# Patient Record
Sex: Male | Born: 1935
Health system: Southern US, Community
[De-identification: ages and names within clinical notes are randomized; demographics above are authoritative.]

## PROBLEM LIST (undated history)

## (undated) DIAGNOSIS — K259 Gastric ulcer, unspecified as acute or chronic, without hemorrhage or perforation: Secondary | ICD-10-CM

## (undated) DIAGNOSIS — G4730 Sleep apnea, unspecified: Secondary | ICD-10-CM

## (undated) DIAGNOSIS — I714 Abdominal aortic aneurysm, without rupture, unspecified: Secondary | ICD-10-CM

## (undated) DIAGNOSIS — E782 Mixed hyperlipidemia: Secondary | ICD-10-CM

## (undated) DIAGNOSIS — R5383 Other fatigue: Secondary | ICD-10-CM

## (undated) DIAGNOSIS — F329 Major depressive disorder, single episode, unspecified: Secondary | ICD-10-CM

## (undated) DIAGNOSIS — N189 Chronic kidney disease, unspecified: Secondary | ICD-10-CM

## (undated) DIAGNOSIS — M8580 Other specified disorders of bone density and structure, unspecified site: Secondary | ICD-10-CM

## (undated) DIAGNOSIS — I119 Hypertensive heart disease without heart failure: Secondary | ICD-10-CM

## (undated) DIAGNOSIS — J449 Chronic obstructive pulmonary disease, unspecified: Secondary | ICD-10-CM

## (undated) DIAGNOSIS — K219 Gastro-esophageal reflux disease without esophagitis: Secondary | ICD-10-CM

## (undated) DIAGNOSIS — E119 Type 2 diabetes mellitus without complications: Secondary | ICD-10-CM

## (undated) DIAGNOSIS — I2510 Atherosclerotic heart disease of native coronary artery without angina pectoris: Secondary | ICD-10-CM

## (undated) DIAGNOSIS — Z86010 Personal history of colonic polyps: Secondary | ICD-10-CM

## (undated) DIAGNOSIS — A048 Other specified bacterial intestinal infections: Secondary | ICD-10-CM

## (undated) DIAGNOSIS — F32A Depression, unspecified: Secondary | ICD-10-CM

## (undated) DIAGNOSIS — R413 Other amnesia: Secondary | ICD-10-CM

## (undated) DIAGNOSIS — Z8489 Family history of other specified conditions: Secondary | ICD-10-CM

## (undated) DIAGNOSIS — R739 Hyperglycemia, unspecified: Secondary | ICD-10-CM

## (undated) DIAGNOSIS — E291 Testicular hypofunction: Secondary | ICD-10-CM

## (undated) DIAGNOSIS — E7800 Pure hypercholesterolemia, unspecified: Secondary | ICD-10-CM

## (undated) DIAGNOSIS — I10 Essential (primary) hypertension: Secondary | ICD-10-CM

## (undated) DIAGNOSIS — R931 Abnormal findings on diagnostic imaging of heart and coronary circulation: Secondary | ICD-10-CM

## (undated) DIAGNOSIS — I712 Thoracic aortic aneurysm, without rupture, unspecified: Secondary | ICD-10-CM

## (undated) DIAGNOSIS — K2270 Barrett's esophagus without dysplasia: Secondary | ICD-10-CM

## (undated) DIAGNOSIS — E559 Vitamin D deficiency, unspecified: Secondary | ICD-10-CM

## (undated) DIAGNOSIS — R9431 Abnormal electrocardiogram [ECG] [EKG]: Secondary | ICD-10-CM

## (undated) DIAGNOSIS — K635 Polyp of colon: Secondary | ICD-10-CM

## (undated) HISTORY — DX: Testicular hypofunction: E29.1

## (undated) HISTORY — DX: Abdominal aortic aneurysm, without rupture, unspecified: I71.40

## (undated) HISTORY — DX: Abdominal aortic aneurysm, without rupture: I71.4

## (undated) HISTORY — DX: Abnormal electrocardiogram (ECG) (EKG): R94.31

## (undated) HISTORY — PX: HEMICOLECTOMY: SHX854

## (undated) HISTORY — DX: Personal history of colonic polyps: Z86.010

## (undated) HISTORY — DX: Barrett's esophagus without dysplasia: K22.70

## (undated) HISTORY — DX: Chronic obstructive pulmonary disease, unspecified: J44.9

## (undated) HISTORY — DX: Sleep apnea, unspecified: G47.30

## (undated) HISTORY — DX: Gastro-esophageal reflux disease without esophagitis: K21.9

## (undated) HISTORY — DX: Type 2 diabetes mellitus without complications: E11.9

## (undated) HISTORY — DX: Depression, unspecified: F32.A

## (undated) HISTORY — DX: Mixed hyperlipidemia: E78.2

## (undated) HISTORY — PX: OTHER SURGICAL HISTORY: SHX169

## (undated) HISTORY — PX: PROSTATECTOMY: SHX69

## (undated) HISTORY — DX: Other specified disorders of bone density and structure, unspecified site: M85.80

## (undated) HISTORY — DX: Other amnesia: R41.3

## (undated) HISTORY — DX: Other fatigue: R53.83

## (undated) HISTORY — DX: Hypertensive heart disease without heart failure: I11.9

## (undated) HISTORY — DX: Gastric ulcer, unspecified as acute or chronic, without hemorrhage or perforation: K25.9

## (undated) HISTORY — DX: Hyperglycemia, unspecified: R73.9

## (undated) HISTORY — DX: Vitamin D deficiency, unspecified: E55.9

## (undated) HISTORY — DX: Other specified bacterial intestinal infections: A04.8

## (undated) HISTORY — DX: Major depressive disorder, single episode, unspecified: F32.9

## (undated) HISTORY — DX: Essential (primary) hypertension: I10

## (undated) HISTORY — DX: Polyp of colon: K63.5

## (undated) HISTORY — DX: Abnormal findings on diagnostic imaging of heart and coronary circulation: R93.1

## (undated) HISTORY — DX: Chronic kidney disease, unspecified: N18.9

## (undated) HISTORY — PX: CATARACT EXTRACTION: SUR2

## (undated) HISTORY — DX: Pure hypercholesterolemia, unspecified: E78.00

---

## 2015-10-01 HISTORY — PX: BACK SURGERY: SHX140

## 2018-01-25 ENCOUNTER — Encounter: Payer: Self-pay | Admitting: *Deleted

## 2018-01-27 ENCOUNTER — Ambulatory Visit: Payer: Medicare HMO | Admitting: Diagnostic Neuroimaging

## 2018-09-14 ENCOUNTER — Encounter (HOSPITAL_BASED_OUTPATIENT_CLINIC_OR_DEPARTMENT_OTHER): Payer: Self-pay

## 2018-09-14 ENCOUNTER — Other Ambulatory Visit: Payer: Self-pay

## 2018-09-14 ENCOUNTER — Emergency Department (HOSPITAL_BASED_OUTPATIENT_CLINIC_OR_DEPARTMENT_OTHER)
Admission: EM | Admit: 2018-09-14 | Discharge: 2018-09-14 | Disposition: A | Discharge status: Home / Self Care | Payer: Medicare Other | Attending: Emergency Medicine | Admitting: Emergency Medicine

## 2018-09-14 ENCOUNTER — Emergency Department (HOSPITAL_BASED_OUTPATIENT_CLINIC_OR_DEPARTMENT_OTHER): Payer: Medicare Other

## 2018-09-14 DIAGNOSIS — Z7982 Long term (current) use of aspirin: Secondary | ICD-10-CM | POA: Diagnosis not present

## 2018-09-14 DIAGNOSIS — N182 Chronic kidney disease, stage 2 (mild): Secondary | ICD-10-CM | POA: Insufficient documentation

## 2018-09-14 DIAGNOSIS — Y998 Other external cause status: Secondary | ICD-10-CM | POA: Diagnosis not present

## 2018-09-14 DIAGNOSIS — S99921A Unspecified injury of right foot, initial encounter: Secondary | ICD-10-CM | POA: Diagnosis present

## 2018-09-14 DIAGNOSIS — J449 Chronic obstructive pulmonary disease, unspecified: Secondary | ICD-10-CM | POA: Insufficient documentation

## 2018-09-14 DIAGNOSIS — Y92481 Parking lot as the place of occurrence of the external cause: Secondary | ICD-10-CM | POA: Insufficient documentation

## 2018-09-14 DIAGNOSIS — S92901A Unspecified fracture of right foot, initial encounter for closed fracture: Secondary | ICD-10-CM

## 2018-09-14 DIAGNOSIS — E1122 Type 2 diabetes mellitus with diabetic chronic kidney disease: Secondary | ICD-10-CM | POA: Diagnosis not present

## 2018-09-14 DIAGNOSIS — Z79899 Other long term (current) drug therapy: Secondary | ICD-10-CM | POA: Insufficient documentation

## 2018-09-14 DIAGNOSIS — Y9389 Activity, other specified: Secondary | ICD-10-CM | POA: Insufficient documentation

## 2018-09-14 DIAGNOSIS — I129 Hypertensive chronic kidney disease with stage 1 through stage 4 chronic kidney disease, or unspecified chronic kidney disease: Secondary | ICD-10-CM | POA: Diagnosis not present

## 2018-09-14 DIAGNOSIS — Z87891 Personal history of nicotine dependence: Secondary | ICD-10-CM | POA: Diagnosis not present

## 2018-09-14 MED ORDER — TRAMADOL HCL 50 MG PO TABS: 50.0000 mg | ORAL_TABLET | Freq: Four times a day (QID) | ORAL | 0 refills | Status: DC | PRN

## 2018-09-14 MED FILL — traMADol HCL 50 MG TABS: 50 | 3 days supply | Qty: 15 | Fill #0
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
## 2018-09-14 NOTE — Discharge Instructions (Signed)
Call and make an appointment to sports medicine.  X-rays show evidence of a right cuboid fracture.  Use the cam walker this is the recommended treatment for that type of fracture.  Take the tramadol as needed for pain.  Prescription provided.

## 2018-11-02 ENCOUNTER — Other Ambulatory Visit: Payer: Self-pay

## 2018-11-02 ENCOUNTER — Encounter (HOSPITAL_BASED_OUTPATIENT_CLINIC_OR_DEPARTMENT_OTHER): Payer: Self-pay | Admitting: Emergency Medicine

## 2018-11-02 ENCOUNTER — Emergency Department (HOSPITAL_BASED_OUTPATIENT_CLINIC_OR_DEPARTMENT_OTHER): Payer: Medicare Other

## 2018-11-02 ENCOUNTER — Inpatient Hospital Stay (HOSPITAL_BASED_OUTPATIENT_CLINIC_OR_DEPARTMENT_OTHER)
Admission: EM | Admit: 2018-11-02 | Discharge: 2018-11-04 | DRG: 313 | Disposition: A | Discharge status: Home Health | Payer: Medicare Other | Attending: Internal Medicine | Admitting: Internal Medicine

## 2018-11-02 DIAGNOSIS — J438 Other emphysema: Secondary | ICD-10-CM | POA: Diagnosis not present

## 2018-11-02 DIAGNOSIS — Z7982 Long term (current) use of aspirin: Secondary | ICD-10-CM | POA: Diagnosis not present

## 2018-11-02 DIAGNOSIS — Z8719 Personal history of other diseases of the digestive system: Secondary | ICD-10-CM

## 2018-11-02 DIAGNOSIS — N183 Chronic kidney disease, stage 3 (moderate): Secondary | ICD-10-CM | POA: Diagnosis present

## 2018-11-02 DIAGNOSIS — I10 Essential (primary) hypertension: Secondary | ICD-10-CM | POA: Diagnosis not present

## 2018-11-02 DIAGNOSIS — Z87891 Personal history of nicotine dependence: Secondary | ICD-10-CM | POA: Diagnosis not present

## 2018-11-02 DIAGNOSIS — Z833 Family history of diabetes mellitus: Secondary | ICD-10-CM

## 2018-11-02 DIAGNOSIS — I2510 Atherosclerotic heart disease of native coronary artery without angina pectoris: Secondary | ICD-10-CM | POA: Diagnosis present

## 2018-11-02 DIAGNOSIS — F329 Major depressive disorder, single episode, unspecified: Secondary | ICD-10-CM | POA: Diagnosis present

## 2018-11-02 DIAGNOSIS — R001 Bradycardia, unspecified: Secondary | ICD-10-CM | POA: Diagnosis present

## 2018-11-02 DIAGNOSIS — K2270 Barrett's esophagus without dysplasia: Secondary | ICD-10-CM | POA: Diagnosis present

## 2018-11-02 DIAGNOSIS — R718 Other abnormality of red blood cells: Secondary | ICD-10-CM | POA: Diagnosis present

## 2018-11-02 DIAGNOSIS — N179 Acute kidney failure, unspecified: Secondary | ICD-10-CM | POA: Diagnosis present

## 2018-11-02 DIAGNOSIS — E1122 Type 2 diabetes mellitus with diabetic chronic kidney disease: Secondary | ICD-10-CM | POA: Diagnosis present

## 2018-11-02 DIAGNOSIS — Z8249 Family history of ischemic heart disease and other diseases of the circulatory system: Secondary | ICD-10-CM

## 2018-11-02 DIAGNOSIS — R079 Chest pain, unspecified: Secondary | ICD-10-CM | POA: Diagnosis present

## 2018-11-02 DIAGNOSIS — E7800 Pure hypercholesterolemia, unspecified: Secondary | ICD-10-CM

## 2018-11-02 DIAGNOSIS — N281 Cyst of kidney, acquired: Secondary | ICD-10-CM | POA: Diagnosis present

## 2018-11-02 DIAGNOSIS — F0390 Unspecified dementia without behavioral disturbance: Secondary | ICD-10-CM | POA: Diagnosis present

## 2018-11-02 DIAGNOSIS — K219 Gastro-esophageal reflux disease without esophagitis: Secondary | ICD-10-CM | POA: Diagnosis present

## 2018-11-02 DIAGNOSIS — K210 Gastro-esophageal reflux disease with esophagitis: Secondary | ICD-10-CM

## 2018-11-02 DIAGNOSIS — Z79899 Other long term (current) drug therapy: Secondary | ICD-10-CM

## 2018-11-02 DIAGNOSIS — R0789 Other chest pain: Secondary | ICD-10-CM | POA: Diagnosis present

## 2018-11-02 DIAGNOSIS — I712 Thoracic aortic aneurysm, without rupture: Secondary | ICD-10-CM | POA: Diagnosis present

## 2018-11-02 DIAGNOSIS — M810 Age-related osteoporosis without current pathological fracture: Secondary | ICD-10-CM | POA: Diagnosis present

## 2018-11-02 DIAGNOSIS — Z8711 Personal history of peptic ulcer disease: Secondary | ICD-10-CM | POA: Diagnosis not present

## 2018-11-02 DIAGNOSIS — E782 Mixed hyperlipidemia: Secondary | ICD-10-CM | POA: Diagnosis present

## 2018-11-02 DIAGNOSIS — I2699 Other pulmonary embolism without acute cor pulmonale: Secondary | ICD-10-CM

## 2018-11-02 DIAGNOSIS — G4733 Obstructive sleep apnea (adult) (pediatric): Secondary | ICD-10-CM | POA: Diagnosis present

## 2018-11-02 DIAGNOSIS — R071 Chest pain on breathing: Secondary | ICD-10-CM | POA: Diagnosis not present

## 2018-11-02 DIAGNOSIS — K259 Gastric ulcer, unspecified as acute or chronic, without hemorrhage or perforation: Secondary | ICD-10-CM | POA: Diagnosis present

## 2018-11-02 DIAGNOSIS — N182 Chronic kidney disease, stage 2 (mild): Secondary | ICD-10-CM | POA: Diagnosis present

## 2018-11-02 DIAGNOSIS — J449 Chronic obstructive pulmonary disease, unspecified: Secondary | ICD-10-CM | POA: Diagnosis present

## 2018-11-02 DIAGNOSIS — F32A Depression, unspecified: Secondary | ICD-10-CM | POA: Diagnosis present

## 2018-11-02 DIAGNOSIS — Z79891 Long term (current) use of opiate analgesic: Secondary | ICD-10-CM

## 2018-11-02 DIAGNOSIS — R7989 Other specified abnormal findings of blood chemistry: Secondary | ICD-10-CM | POA: Diagnosis present

## 2018-11-02 DIAGNOSIS — I1310 Hypertensive heart and chronic kidney disease without heart failure, with stage 1 through stage 4 chronic kidney disease, or unspecified chronic kidney disease: Secondary | ICD-10-CM | POA: Diagnosis present

## 2018-11-02 DIAGNOSIS — R413 Other amnesia: Secondary | ICD-10-CM

## 2018-11-02 DIAGNOSIS — R778 Other specified abnormalities of plasma proteins: Secondary | ICD-10-CM

## 2018-11-02 HISTORY — DX: Atherosclerotic heart disease of native coronary artery without angina pectoris: I25.10

## 2018-11-02 HISTORY — DX: Thoracic aortic aneurysm, without rupture, unspecified: I71.20

## 2018-11-02 HISTORY — DX: Thoracic aortic aneurysm, without rupture: I71.2

## 2018-11-02 LAB — TROPONIN I: Troponin I: 0.03 ng/mL (ref <0.03)

## 2018-11-02 LAB — COMPREHENSIVE METABOLIC PANEL
ALT: 25 U/L (ref 0–44)
AST: 25 U/L (ref 15–41)
Albumin: 4.2 g/dL (ref 3.5–5.0)
Alkaline Phosphatase: 71 U/L (ref 38–126)
Anion gap: 8 (ref 5–15)
BUN: 25 mg/dL — ABNORMAL HIGH (ref 8–23)
CO2: 25 mmol/L (ref 22–32)
Calcium: 8.9 mg/dL (ref 8.9–10.3)
Chloride: 105 mmol/L (ref 98–111)
Creatinine, Ser: 1.62 mg/dL — ABNORMAL HIGH (ref 0.61–1.24)
GFR calc Af Amer: 45 mL/min — ABNORMAL LOW (ref >60)
GFR calc non Af Amer: 39 mL/min — ABNORMAL LOW (ref >60)
Glucose, Bld: 99 mg/dL (ref 70–99)
Potassium: 3.5 mmol/L (ref 3.5–5.1)
Sodium: 138 mmol/L (ref 135–145)
Total Bilirubin: 0.8 mg/dL (ref 0.3–1.2)
Total Protein: 7.4 g/dL (ref 6.5–8.1)

## 2018-11-02 LAB — CBC WITH DIFFERENTIAL/PLATELET
Abs Immature Granulocytes: 0.02 10*3/uL (ref 0.00–0.07)
Basophils Absolute: 0.1 10*3/uL (ref 0.0–0.1)
Basophils Relative: 1 %
Eosinophils Absolute: 0.2 10*3/uL (ref 0.0–0.5)
Eosinophils Relative: 3 %
HCT: 46.1 % (ref 39.0–52.0)
Hemoglobin: 14 g/dL (ref 13.0–17.0)
Immature Granulocytes: 0 %
Lymphocytes Relative: 16 %
Lymphs Abs: 1 10*3/uL (ref 0.7–4.0)
MCH: 23 pg — ABNORMAL LOW (ref 26.0–34.0)
MCHC: 30.4 g/dL (ref 30.0–36.0)
MCV: 75.6 fL — ABNORMAL LOW (ref 80.0–100.0)
Monocytes Absolute: 0.5 10*3/uL (ref 0.1–1.0)
Monocytes Relative: 7 %
Neutro Abs: 4.7 10*3/uL (ref 1.7–7.7)
Neutrophils Relative %: 73 %
Platelets: 207 10*3/uL (ref 150–400)
RBC: 6.1 MIL/uL — ABNORMAL HIGH (ref 4.22–5.81)
RDW: 16.4 % — ABNORMAL HIGH (ref 11.5–15.5)
WBC: 6.5 10*3/uL (ref 4.0–10.5)
nRBC: 0 % (ref 0.0–0.2)

## 2018-11-02 LAB — BRAIN NATRIURETIC PEPTIDE: B Natriuretic Peptide: 16.2 pg/mL (ref 0.0–100.0)

## 2018-11-02 MED ORDER — TRAZODONE HCL 50 MG PO TABS
50.0000 mg | ORAL_TABLET | Freq: Every evening | ORAL | Status: DC | PRN
  Administered 2018-11-02 – 2018-11-03 (×2): 50 mg via ORAL
  Filled 2018-11-02 (×2): qty 1

## 2018-11-02 MED ORDER — TOPIRAMATE 25 MG PO TABS
25.0000 mg | ORAL_TABLET | Freq: Every day | ORAL | Status: DC
  Administered 2018-11-03 – 2018-11-04 (×2): 25 mg via ORAL
  Filled 2018-11-02 (×2): qty 1

## 2018-11-02 MED ORDER — ONDANSETRON HCL 4 MG/2ML IJ SOLN: 4.0000 mg | Freq: Four times a day (QID) | INTRAMUSCULAR | Status: DC | PRN

## 2018-11-02 MED ORDER — SUCRALFATE 1 G PO TABS
1.0000 g | ORAL_TABLET | ORAL | Status: DC
  Administered 2018-11-04: 1 g via ORAL
  Filled 2018-11-02: qty 1

## 2018-11-02 MED ORDER — ACETAMINOPHEN 325 MG PO TABS: 650.0000 mg | ORAL_TABLET | ORAL | Status: DC | PRN

## 2018-11-02 MED ORDER — ALBUTEROL SULFATE (2.5 MG/3ML) 0.083% IN NEBU: 3.0000 mL | INHALATION_SOLUTION | RESPIRATORY_TRACT | Status: DC | PRN

## 2018-11-02 MED ORDER — ASPIRIN EC 325 MG PO TBEC
325.0000 mg | DELAYED_RELEASE_TABLET | Freq: Every day | ORAL | Status: DC
  Administered 2018-11-03 – 2018-11-04 (×2): 325 mg via ORAL
  Filled 2018-11-02 (×2): qty 1

## 2018-11-02 MED ORDER — ASPIRIN EC 325 MG PO TBEC
325.0000 mg | DELAYED_RELEASE_TABLET | Freq: Once | ORAL | Status: AC
  Administered 2018-11-02: 325 mg via ORAL
  Filled 2018-11-02: qty 1

## 2018-11-02 MED ORDER — ENOXAPARIN SODIUM 40 MG/0.4ML ~~LOC~~ SOLN
40.0000 mg | Freq: Every day | SUBCUTANEOUS | Status: DC
  Administered 2018-11-02 – 2018-11-03 (×2): 40 mg via SUBCUTANEOUS
  Filled 2018-11-02 (×2): qty 0.4

## 2018-11-02 MED ORDER — MORPHINE SULFATE (PF) 2 MG/ML IV SOLN: 1.0000 mg | INTRAVENOUS | Status: DC | PRN

## 2018-11-02 MED ORDER — CYCLOBENZAPRINE HCL 10 MG PO TABS
10.0000 mg | ORAL_TABLET | Freq: Two times a day (BID) | ORAL | Status: DC
  Administered 2018-11-02 – 2018-11-04 (×4): 10 mg via ORAL
  Filled 2018-11-02 (×4): qty 1

## 2018-11-02 MED ORDER — HYDRALAZINE HCL 20 MG/ML IJ SOLN: 5.0000 mg | INTRAMUSCULAR | Status: DC | PRN

## 2018-11-02 MED ORDER — PANTOPRAZOLE SODIUM 40 MG PO TBEC
40.0000 mg | DELAYED_RELEASE_TABLET | Freq: Two times a day (BID) | ORAL | Status: DC
  Administered 2018-11-02 – 2018-11-04 (×4): 40 mg via ORAL
  Filled 2018-11-02 (×4): qty 1

## 2018-11-02 MED ORDER — AMLODIPINE BESYLATE 2.5 MG PO TABS: 5.0000 mg | ORAL_TABLET | Freq: Every day | ORAL | Status: DC

## 2018-11-02 MED ORDER — ATORVASTATIN CALCIUM 10 MG PO TABS
20.0000 mg | ORAL_TABLET | Freq: Every day | ORAL | Status: DC
  Administered 2018-11-03 – 2018-11-04 (×2): 20 mg via ORAL
  Filled 2018-11-02 (×2): qty 2

## 2018-11-02 MED ORDER — ASPIRIN EC 325 MG PO TBEC
DELAYED_RELEASE_TABLET | ORAL | Status: AC
  Filled 2018-11-02: qty 1

## 2018-11-02 MED ORDER — TRAMADOL HCL 50 MG PO TABS
50.0000 mg | ORAL_TABLET | Freq: Four times a day (QID) | ORAL | Status: DC | PRN
  Filled 2018-11-02: qty 1

## 2018-11-02 MED ORDER — POLYETHYLENE GLYCOL 3350 17 G PO PACK: 17.0000 g | PACK | Freq: Every day | ORAL | Status: DC | PRN

## 2018-11-02 MED ORDER — TERAZOSIN HCL 1 MG PO CAPS
1.0000 mg | ORAL_CAPSULE | Freq: Every day | ORAL | Status: DC
  Administered 2018-11-02 – 2018-11-03 (×2): 1 mg via ORAL
  Filled 2018-11-02 (×2): qty 1

## 2018-11-02 MED ORDER — CLOTRIMAZOLE 1 % EX CREA: TOPICAL_CREAM | Freq: Two times a day (BID) | CUTANEOUS | Status: DC

## 2018-11-02 MED ORDER — SODIUM CHLORIDE 0.9 % IV SOLN
INTRAVENOUS | Status: DC
  Administered 2018-11-02 – 2018-11-04 (×4): via INTRAVENOUS

## 2018-11-02 MED ORDER — SERTRALINE HCL 50 MG PO TABS
50.0000 mg | ORAL_TABLET | Freq: Every day | ORAL | Status: DC
  Administered 2018-11-03 – 2018-11-04 (×2): 50 mg via ORAL
  Filled 2018-11-02 (×2): qty 1

## 2018-11-02 MED ORDER — NITROGLYCERIN 0.4 MG SL SUBL: 0.4000 mg | SUBLINGUAL_TABLET | SUBLINGUAL | Status: DC | PRN

## 2018-11-02 MED ORDER — AMLODIPINE BESYLATE 10 MG PO TABS: 10.0000 mg | ORAL_TABLET | Freq: Every day | ORAL | Status: DC

## 2018-11-02 MED ORDER — SODIUM CHLORIDE 0.9 % IV BOLUS
500.0000 mL | Freq: Once | INTRAVENOUS | Status: AC
  Administered 2018-11-02: 500 mL via INTRAVENOUS

## 2018-11-02 MED ORDER — ADULT MULTIVITAMIN W/MINERALS CH
1.0000 | ORAL_TABLET | Freq: Every day | ORAL | Status: DC
  Administered 2018-11-03 – 2018-11-04 (×2): 1 via ORAL
  Filled 2018-11-02 (×2): qty 1

## 2018-11-02 MED ORDER — DM-GUAIFENESIN ER 30-600 MG PO TB12
1.0000 | ORAL_TABLET | Freq: Two times a day (BID) | ORAL | Status: DC | PRN
  Administered 2018-11-03 (×2): 1 via ORAL
  Filled 2018-11-02 (×2): qty 1
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
## 2018-11-02 NOTE — ED Notes (Signed)
[facility] notified (Tammy) - patient is ready for transport 

## 2018-11-02 NOTE — ED Notes (Signed)
Pt's daughter is Publishing rights manager phone #: [phone].

## 2018-11-02 NOTE — ED Triage Notes (Signed)
IntermittentRight sided CP lateral to breast area.  Also some SOB.  Pt is poor historian.  He sts his symptoms have been going on "for a while". Daughter/POA/Caregiver sts he has early dementia.

## 2018-11-02 NOTE — ED Notes (Signed)
Date and time results received: 11/02/18 1747  Test:1748 Critical Value: trp  Name of Provider Notified: Cristie Hem, [place] Orders Received? Or Actions Taken?: no orders given

## 2018-11-03 ENCOUNTER — Encounter (HOSPITAL_COMMUNITY): Payer: Self-pay | Admitting: Cardiology

## 2018-11-03 ENCOUNTER — Inpatient Hospital Stay (HOSPITAL_COMMUNITY): Payer: Medicare Other

## 2018-11-03 ENCOUNTER — Other Ambulatory Visit: Payer: Self-pay

## 2018-11-03 DIAGNOSIS — N179 Acute kidney failure, unspecified: Secondary | ICD-10-CM

## 2018-11-03 DIAGNOSIS — R071 Chest pain on breathing: Secondary | ICD-10-CM

## 2018-11-03 LAB — URINALYSIS, ROUTINE W REFLEX MICROSCOPIC
Bilirubin Urine: NEGATIVE
Glucose, UA: NEGATIVE mg/dL
Hgb urine dipstick: NEGATIVE
Ketones, ur: NEGATIVE mg/dL
Leukocytes,Ua: NEGATIVE
Nitrite: NEGATIVE
Protein, ur: NEGATIVE mg/dL
Specific Gravity, Urine: 1.014 (ref 1.005–1.030)
pH: 6 (ref 5.0–8.0)

## 2018-11-03 LAB — LIPID PANEL
Cholesterol: 130 mg/dL (ref 0–200)
HDL: 43 mg/dL (ref >40)
LDL Cholesterol: 73 mg/dL (ref 0–99)
Total CHOL/HDL Ratio: 3 RATIO
Triglycerides: 72 mg/dL (ref <150)
VLDL: 14 mg/dL (ref 0–40)

## 2018-11-03 LAB — BASIC METABOLIC PANEL
Anion gap: 8 (ref 5–15)
BUN: 18 mg/dL (ref 8–23)
CO2: 23 mmol/L (ref 22–32)
Calcium: 8.6 mg/dL — ABNORMAL LOW (ref 8.9–10.3)
Chloride: 109 mmol/L (ref 98–111)
Creatinine, Ser: 1.28 mg/dL — ABNORMAL HIGH (ref 0.61–1.24)
GFR calc Af Amer: >60 mL/min (ref >60)
GFR calc non Af Amer: 52 mL/min — ABNORMAL LOW (ref >60)
Glucose, Bld: 87 mg/dL (ref 70–99)
Potassium: 3.5 mmol/L (ref 3.5–5.1)
Sodium: 140 mmol/L (ref 135–145)

## 2018-11-03 LAB — TROPONIN I
Troponin I: <0.03 ng/mL (ref <0.03)
Troponin I: <0.03 ng/mL (ref <0.03)
Troponin I: <0.03 ng/mL (ref <0.03)

## 2018-11-03 LAB — HEMOGLOBIN A1C
Hgb A1c MFr Bld: 6.3 % — ABNORMAL HIGH (ref 4.8–5.6)
Mean Plasma Glucose: 134.11 mg/dL

## 2018-11-03 LAB — D-DIMER, QUANTITATIVE: D-Dimer, Quant: 3.16 ug/mL-FEU — ABNORMAL HIGH (ref 0.00–0.50)

## 2018-11-03 MED ORDER — GLUCERNA SHAKE PO LIQD
237.0000 mL | Freq: Three times a day (TID) | ORAL | Status: DC
  Administered 2018-11-04: 237 mL via ORAL

## 2018-11-03 MED ORDER — AMLODIPINE BESYLATE 2.5 MG PO TABS
2.5000 mg | ORAL_TABLET | Freq: Every day | ORAL | Status: DC
  Administered 2018-11-03 – 2018-11-04 (×2): 2.5 mg via ORAL
  Filled 2018-11-03 (×2): qty 1

## 2018-11-03 NOTE — Progress Notes (Signed)
Patient's daughter (Essence Cornelia Copa) called to request to talk to patient MD.  Erlinda Hong MD aware.

## 2018-11-03 NOTE — Progress Notes (Signed)
PT Cancellation Note  Patient Details Name: Mitchell Rush MRN: [id number] DOB: [birth year]   Cancelled Treatment:    Reason Eval/Treat Not Completed: Patient at procedure or test/unavailable attempted to work with patient, he was OTF at [place] procedure. Will attempt to try back later if time/schedule allow.    Deniece Ree PT, DPT, CBIS  Supplemental Physical Therapist [facility]    Pager [phone] Acute Rehab Office [phone]

## 2018-11-03 NOTE — TOC Initial Note (Addendum)
Transition of Care [facility]) - Initial/Assessment Note    Patient Details  Name: Mitchell Rush MRN: [id number] Date of Birth: 1935-03-03  Transition of Care [facility]) CM/SW Contact:    Sherrilyn Rist Phone Number: [phone] 11/03/2018, 11:12 AM  Clinical Narrative:                 CM talked to daughter Essence HCPOA; CM informed her to bring in paper that states that she is HCPOA to be placed on the chart. She stated that there are 12 children and they will talk about arrangements for 24 hr care; [place] choice offered, she chose [place]; Dan with [facility] [employer]) called for arrangements. Has private insurance with [facility] with prescription drug coverage; [facility]; Patient was going to Outpatient Physical Therapy but per daughter due to Coronavirus she wants [place] services.  Expected Discharge Plan: [place] Barriers to Discharge: No Barriers Identified   Patient Goals and CMS Choice Patient states their goals for this hospitalization and ongoing recovery are:: to stay at home CMS Medicare.gov Compare Post Acute Care list provided to:: Other (Comment Required)(daughter Essence HCPOA) Choice offered to / list presented to : [facility] POA / Guardian  Expected Discharge Plan and Services Expected Discharge Plan: [place] In-house Referral: NA Discharge Planning Services: CM Consult Post Acute Care Choice: NA Living arrangements for the past 2 months: Single Family Home                 DME Arranged: N/A DME Agency: NA HH Arranged: RN, PT, Nurse's Aide [place] Agency: [place] ([facility])  Prior Living Arrangements/Services Living arrangements for the past 2 months: Single Family Home Lives with:: Self Patient language and need for interpreter reviewed:: No Do you feel safe going back to the place where you live?: Yes      Need for Family Participation in Patient Care:  Yes (Comment)(Family members to arrange for 24 hr care)     Criminal Activity/Legal Involvement Pertinent to Current Situation/Hospitalization: No - Comment as needed  Activities of Daily Living Home Assistive Devices/Equipment: Environmental consultant (specify type), Cane (specify quad or straight), Shower chair with back, Bedside commode/3-in-1 ADL Screening (condition at time of admission) Patient's cognitive ability adequate to safely complete daily activities?: Yes Is the patient deaf or have difficulty hearing?: No Does the patient have difficulty seeing, even when wearing glasses/contacts?: No Does the patient have difficulty concentrating, remembering, or making decisions?: Yes Patient able to express need for assistance with ADLs?: Yes Does the patient have difficulty dressing or bathing?: No Independently performs ADLs?: Yes (appropriate for developmental age) Does the patient have difficulty walking or climbing stairs?: Yes Weakness of Legs: Both Weakness of Arms/Hands: None  Permission Sought/Granted Permission sought to share information with : Case Manager Permission granted to share information with : Yes, Verbal Permission Granted  Share Information with NAME: [place] agency           Emotional Assessment Appearance:: Developmentally appropriate Attitude/Demeanor/Rapport: Engaged Affect (typically observed): Accepting Orientation: : Fluctuating Orientation (Suspected and/or reported Sundowners)(Dementia) Alcohol / Substance Use: Not Applicable Psych Involvement: No (comment)  Admission diagnosis:  Elevated troponin [R79.89] AKI (acute kidney injury) ([place]) [N17.9] Patient Active Problem List   Diagnosis Date Noted  . Elevated troponin 11/02/2018  . Chest pain 11/02/2018  . GERD (gastroesophageal reflux disease) 11/02/2018  . Barrett's esophagus   . Gastric ulcer   .  Chronic kidney disease (CKD), stage II (mild)   . COPD (chronic obstructive pulmonary disease) ([place])   .  Depression   . Hypercholesteremia   . Hypertension    PCP:  Windell Hummingbird, PA-C Pharmacy:   [facility], [place] [address] [place] [address] Phone: [phone] Fax: [phone]  [id number] [place], [place] [address] Phone: [phone] Fax: [phone]     Social Determinants of Health (SDOH) Interventions    Readmission Risk Interventions No flowsheet data found.

## 2018-11-03 NOTE — Progress Notes (Signed)
Transportation here to pick up pt to ultrasound.

## 2018-11-03 NOTE — Evaluation (Signed)
Physical Therapy Evaluation Patient Details Name: Mitchell Rush MRN: [id number] DOB: 29-Mar-1935 Today's Date: 11/03/2018   History of Present Illness  84yo male presenting with intermittent chest pain of more than 1 week; it is moderate, dull, and non-radiating in nature, non-pleuritic. Some SOB but this and pain is not aggravated by coughing or respiration. CXR negative/clear, afebrile. Admitted for chest pain workup. PMH AAA, CKD, COPD, DM, HTN, memory loss, hx lumbar laminectomy   Clinical Impression   Patient received in bed, very pleasant and willing to participate with PT today. Able to complete bed mobility, functional transfers, and gait approximately 236f with S-Mod(I) and no device, steady and safe at self-selected pace and reports that he is generally at his functional baseline. He was left up in the recliner with all needs met and questions/concerns addressed this morning. Patient at baseline level of function- PT signing off. Thank you for the referral!     Follow Up Recommendations No PT follow up    Equipment Recommendations  None recommended by PT    Recommendations for Other Services       Precautions / Restrictions Precautions Precautions: Other (comment) Precaution Comments: poor vision- no sight in R eye and vision in L eye is very blurry  Restrictions Weight Bearing Restrictions: No      Mobility  Bed Mobility Overal bed mobility: Modified Independent             General bed mobility comments: extended time, no physical assist given   Transfers Overall transfer level: Modified independent Equipment used: None             General transfer comment: wide BOS, extended time/increased effort   Ambulation/Gait Ambulation/Gait assistance: Supervision Gait Distance (Feet): 250 Feet Assistive device: None Gait Pattern/deviations: WFL(Within Functional Limits);Step-through pattern     General Gait Details: gait pattern generally WNL, no significant  deviations or impairments noted, steady and safe  Stairs            Wheelchair Mobility    Modified Rankin (Stroke Patients Only)       Balance Overall balance assessment: No apparent balance deficits (not formally assessed)                                           Pertinent Vitals/Pain Pain Assessment: No/denies pain    Home Living Family/patient expects to be discharged to:: Private residence Living Arrangements: Alone Available Help at Discharge: Family;Available PRN/intermittently Type of Home: Apartment Home Access: Level entry     Home Layout: One level Home Equipment: Cane - single point      Prior Function Level of Independence: Independent with assistive device(s)               Hand Dominance        Extremity/Trunk Assessment   Upper Extremity Assessment Upper Extremity Assessment: Overall WFL for tasks assessed    Lower Extremity Assessment Lower Extremity Assessment: Overall WFL for tasks assessed    Cervical / Trunk Assessment Cervical / Trunk Assessment: Normal  Communication   Communication: No difficulties  Cognition Arousal/Alertness: Awake/alert Behavior During Therapy: WFL for tasks assessed/performed Overall Cognitive Status: Within Functional Limits for tasks assessed  General Comments      Exercises     Assessment/Plan    PT Assessment Patent does not need any further PT services  PT Problem List         PT Treatment Interventions      PT Goals (Current goals can be found in the Care Plan section)  Acute Rehab PT Goals Patient Stated Goal: go home  PT Goal Formulation: With patient Time For Goal Achievement: 11/17/18 Potential to Achieve Goals: Good    Frequency     Barriers to discharge        Co-evaluation               AM-PAC PT "6 Clicks" Mobility  Outcome Measure Help needed turning from your back to your side  while in a flat bed without using bedrails?: None Help needed moving from lying on your back to sitting on the side of a flat bed without using bedrails?: None Help needed moving to and from a bed to a chair (including a wheelchair)?: None Help needed standing up from a chair using your arms (e.g., wheelchair or bedside chair)?: None Help needed to walk in hospital room?: None Help needed climbing 3-5 steps with a railing? : None 6 Click Score: 24    End of Session   Activity Tolerance: Patient tolerated treatment well Patient left: in chair;with call bell/phone within reach   PT Visit Diagnosis: Muscle weakness (generalized) (M62.81)    Time: 9969-2493 PT Time Calculation (min) (ACUTE ONLY): 15 min   Charges:   PT Evaluation $PT Eval Low Complexity: 1 Low          Deniece Ree PT, DPT, CBIS  Supplemental Physical Therapist [place]    Pager [phone] Acute Rehab Office [phone]
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
## 2018-11-03 NOTE — Progress Notes (Addendum)
Patient's daughter Elliot Cousin is patient's  POA.  She brought by [facility] documentation today.  Copy placed in chart and original copy is in folder at [employer] (please return to patient/family) Per Essence- please update her with plan of care.

## 2018-11-03 NOTE — Progress Notes (Signed)
Initial Nutrition Assessment  RD working remotely.  DOCUMENTATION CODES:   Not applicable  INTERVENTION:   -Continue MVI with minerals daily -Glucerna Shake po TID, each supplement provides 220 kcal and 10 grams of protein  NUTRITION DIAGNOSIS:   Inadequate oral intake related to inability to eat as evidenced by NPO status.  GOAL:   Patient will meet greater than or equal to 90% of their needs  MONITOR:   PO intake, Supplement acceptance, Diet advancement, Labs, Weight trends, Skin, I & O's  REASON FOR ASSESSMENT:   Malnutrition Screening Tool    ASSESSMENT:   Mitchell Rush is a 84 y.o. male with medical history significant of HTN, HLD, diet-controlled DM, CKD-II, depression, gastric ulcer, Barrett's esophagitis, OSA not on CPAP, early stage of dementia, possible BPH, who presents with chest pain.  Pt admitted with chest pain and elevated troponin.   Reviewed I/O's: + 1 L x 24 hours  UOP: 225 ml x 24 hours  Attempted to call pt x 3, however, phone message stated "call failed" on all 3 attempts. Per chart review, pt with early signs of dementia and is a poor historian.   Pt currently NPO due to pending stress test.   Reviewed wt hx; per CareEverywhere, last recorded wt was 89.4 kg (197#) on 01/25/18. This demonstrates a 5.1% wt loss over the past 9 months, which is not significant for time frame.   Given history of dementia and weight loss, pt would benefit from addition of nutritional supplements.   Labs reviewed.   NUTRITION - FOCUSED PHYSICAL EXAM:    Most Recent Value  Orbital Region  Unable to assess  Upper Arm Region  Unable to assess  Thoracic and Lumbar Region  Unable to assess  Buccal Region  Unable to assess  Temple Region  Unable to assess  Clavicle Bone Region  Unable to assess  Clavicle and Acromion Bone Region  Unable to assess  Scapular Bone Region  Unable to assess  Dorsal Hand  Unable to assess  Patellar Region  Unable to assess  Anterior  Thigh Region  Unable to assess  Posterior Calf Region  Unable to assess  Edema (RD Assessment)  Unable to assess  Hair  Unable to assess  Eyes  Unable to assess  Mouth  Unable to assess  Skin  Unable to assess  Nails  Unable to assess       Diet Order:   Diet Order            Diet NPO time specified Except for: Sips with Meds, Ice Chips  Diet effective midnight              EDUCATION NEEDS:   No education needs have been identified at this time  Skin:  Skin Assessment: Reviewed RN Assessment  Last BM:  Unknown  Height:   Ht Readings from Last 1 Encounters:  11/02/18 5\' 8"  (1.727 m)    Weight:   Wt Readings from Last 1 Encounters:  11/03/18 84.8 kg    Ideal Body Weight:  70 kg  BMI:  Body mass index is 28.43 kg/m.  Estimated Nutritional Needs:   Kcal:  6378-5885  Protein:  85-100 grams  Fluid:  1.7-1.9 L    Rusty Villella A. Jimmye Norman, RD, LDN, [place] Registered Dietitian II Certified Diabetes Care and Education Specialist Pager: [phone] After hours Pager: [phone]

## 2018-11-04 ENCOUNTER — Inpatient Hospital Stay (HOSPITAL_COMMUNITY): Payer: Medicare Other

## 2018-11-04 DIAGNOSIS — R413 Other amnesia: Secondary | ICD-10-CM

## 2018-11-04 DIAGNOSIS — J438 Other emphysema: Secondary | ICD-10-CM

## 2018-11-04 LAB — BASIC METABOLIC PANEL
Anion gap: 6 (ref 5–15)
BUN: 15 mg/dL (ref 8–23)
CO2: 23 mmol/L (ref 22–32)
Calcium: 8.3 mg/dL — ABNORMAL LOW (ref 8.9–10.3)
Chloride: 110 mmol/L (ref 98–111)
Creatinine, Ser: 1.19 mg/dL (ref 0.61–1.24)
GFR calc Af Amer: >60 mL/min (ref >60)
GFR calc non Af Amer: 57 mL/min — ABNORMAL LOW (ref >60)
Glucose, Bld: 148 mg/dL — ABNORMAL HIGH (ref 70–99)
Potassium: 3.5 mmol/L (ref 3.5–5.1)
Sodium: 139 mmol/L (ref 135–145)

## 2018-11-04 LAB — CBC WITH DIFFERENTIAL/PLATELET
Abs Immature Granulocytes: 0.01 10*3/uL (ref 0.00–0.07)
Basophils Absolute: 0.1 10*3/uL (ref 0.0–0.1)
Basophils Relative: 1 %
Eosinophils Absolute: 0.4 10*3/uL (ref 0.0–0.5)
Eosinophils Relative: 9 %
HCT: 36.9 % — ABNORMAL LOW (ref 39.0–52.0)
Hemoglobin: 11.5 g/dL — ABNORMAL LOW (ref 13.0–17.0)
Immature Granulocytes: 0 %
Lymphocytes Relative: 29 %
Lymphs Abs: 1.4 10*3/uL (ref 0.7–4.0)
MCH: 23 pg — ABNORMAL LOW (ref 26.0–34.0)
MCHC: 31.2 g/dL (ref 30.0–36.0)
MCV: 73.8 fL — ABNORMAL LOW (ref 80.0–100.0)
Monocytes Absolute: 0.4 10*3/uL (ref 0.1–1.0)
Monocytes Relative: 9 %
Neutro Abs: 2.5 10*3/uL (ref 1.7–7.7)
Neutrophils Relative %: 52 %
Platelets: 186 10*3/uL (ref 150–400)
RBC: 5 MIL/uL (ref 4.22–5.81)
RDW: 15.9 % — ABNORMAL HIGH (ref 11.5–15.5)
WBC: 4.8 10*3/uL (ref 4.0–10.5)
nRBC: 0 % (ref 0.0–0.2)

## 2018-11-04 LAB — MAGNESIUM: Magnesium: 1.8 mg/dL (ref 1.7–2.4)

## 2018-11-04 LAB — IRON AND TIBC
Iron: 34 ug/dL — ABNORMAL LOW (ref 45–182)
Saturation Ratios: 15 % — ABNORMAL LOW (ref 17.9–39.5)
TIBC: 234 ug/dL — ABNORMAL LOW (ref 250–450)
UIBC: 200 ug/dL

## 2018-11-04 MED ORDER — TECHNETIUM TC 99M DIETHYLENETRIAME-PENTAACETIC ACID
28.3000 | Freq: Once | INTRAVENOUS | Status: AC | PRN
  Administered 2018-11-04: 28.3 via RESPIRATORY_TRACT

## 2018-11-04 MED ORDER — TECHNETIUM TO 99M ALBUMIN AGGREGATED
3.8300 | Freq: Once | INTRAVENOUS | Status: AC | PRN
  Administered 2018-11-04: 3.83 via INTRAVENOUS

## 2018-11-04 MED ORDER — NITROGLYCERIN 0.4 MG SL SUBL: 0.4000 mg | SUBLINGUAL_TABLET | SUBLINGUAL | 0 refills | Status: AC | PRN

## 2018-11-04 MED ORDER — LISINOPRIL 40 MG PO TABS: 20.0000 mg | ORAL_TABLET | Freq: Every day | ORAL | 0 refills | Status: AC

## 2018-11-04 MED ORDER — GLUCERNA SHAKE PO LIQD: 237.0000 mL | Freq: Three times a day (TID) | ORAL | 0 refills | Status: AC

## 2018-11-04 NOTE — Care Management (Signed)
Notified [facility] that patient will DC today
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
## 2018-11-04 NOTE — Progress Notes (Signed)
Per nuclear medicine tech Eddie, will need DG 2 chest view order put in. Standing order has been placed.

## 2018-11-04 NOTE — Progress Notes (Addendum)
Attending cardiology review of echo order:  Due to the spread of COVID-19, departmental policy is to review orders for procedures and determine appropriateness regarding testing at this time. The following criteria are being used to limit potential exposure and spread of infection.  Is the patient being evaluated for COVID-19 infection: no  Is the patient actively having infectious respiratory symptoms or fever: no Does the patient have a known history of prior cardiovascular disease: nonobstructive CAD by cath Would the test change management of the patient: no, per Dr. Jacalyn Lefevre note, no further ischemia eval at this time Can the test be performed at a later time: yes, if needed  Special circumstances: Is the patient undergoing evaluation for embolic CVA: no Is the patient planned for chemotherapy: no  Based on the review above, this study is not to be performed at this time. Please see Dr. Jacalyn Lefevre note yesterday for full details.  Addended to add: echo is not a diagnostic test for pulmonary embolism. Recommend VQ scan or CTA if indicated. If PE found and there is concern for right heart failure with hemodynamic instability, please contact us and we will assess for echo.  Buford Dresser, MD, PhD [facility]  [address], [place], [place] [address] [phone]

## 2018-11-04 NOTE — Progress Notes (Signed)
Patient is being discharged home with home health.  Peripheral iv removed, clean dry and intact, pressure and dressing applied. Patient was helped to get dressed.  Patients belongings in a bag in pts room: clothing, dentures on, glasses on, shoes on, jewelery on. Discharge packet given to patient with original POA letter brought by daughter yesterday.  Daughter called to ask where she will pick pt up, instructions given by Network engineer.  Pt is currently in room watching television.

## 2019-01-01 ENCOUNTER — Encounter (HOSPITAL_COMMUNITY): Payer: Self-pay | Admitting: *Deleted

## 2019-01-01 ENCOUNTER — Other Ambulatory Visit: Payer: Self-pay

## 2019-01-01 ENCOUNTER — Emergency Department (HOSPITAL_COMMUNITY)
Admission: EM | Admit: 2019-01-01 | Discharge: 2019-01-02 | Disposition: A | Discharge status: Home / Self Care | Payer: Medicare Other | Attending: Emergency Medicine | Admitting: Emergency Medicine

## 2019-01-01 DIAGNOSIS — J449 Chronic obstructive pulmonary disease, unspecified: Secondary | ICD-10-CM | POA: Diagnosis not present

## 2019-01-01 DIAGNOSIS — Z87891 Personal history of nicotine dependence: Secondary | ICD-10-CM | POA: Diagnosis not present

## 2019-01-01 DIAGNOSIS — R1031 Right lower quadrant pain: Secondary | ICD-10-CM | POA: Diagnosis present

## 2019-01-01 DIAGNOSIS — E1122 Type 2 diabetes mellitus with diabetic chronic kidney disease: Secondary | ICD-10-CM | POA: Diagnosis not present

## 2019-01-01 DIAGNOSIS — N182 Chronic kidney disease, stage 2 (mild): Secondary | ICD-10-CM | POA: Diagnosis not present

## 2019-01-01 DIAGNOSIS — E876 Hypokalemia: Secondary | ICD-10-CM | POA: Diagnosis not present

## 2019-01-01 DIAGNOSIS — I714 Abdominal aortic aneurysm, without rupture, unspecified: Secondary | ICD-10-CM

## 2019-01-01 DIAGNOSIS — Z79899 Other long term (current) drug therapy: Secondary | ICD-10-CM | POA: Diagnosis not present

## 2019-01-01 DIAGNOSIS — Z7982 Long term (current) use of aspirin: Secondary | ICD-10-CM | POA: Insufficient documentation

## 2019-01-01 DIAGNOSIS — I2510 Atherosclerotic heart disease of native coronary artery without angina pectoris: Secondary | ICD-10-CM | POA: Diagnosis not present

## 2019-01-01 DIAGNOSIS — I129 Hypertensive chronic kidney disease with stage 1 through stage 4 chronic kidney disease, or unspecified chronic kidney disease: Secondary | ICD-10-CM | POA: Diagnosis not present

## 2019-01-01 LAB — COMPREHENSIVE METABOLIC PANEL
ALT: 22 U/L (ref 0–44)
AST: 24 U/L (ref 15–41)
Albumin: 3.9 g/dL (ref 3.5–5.0)
Alkaline Phosphatase: 61 U/L (ref 38–126)
Anion gap: 9 (ref 5–15)
BUN: 16 mg/dL (ref 8–23)
CO2: 25 mmol/L (ref 22–32)
Calcium: 8.9 mg/dL (ref 8.9–10.3)
Chloride: 103 mmol/L (ref 98–111)
Creatinine, Ser: 1.06 mg/dL (ref 0.61–1.24)
GFR calc Af Amer: >60 mL/min (ref >60)
GFR calc non Af Amer: >60 mL/min (ref >60)
Glucose, Bld: 126 mg/dL — ABNORMAL HIGH (ref 70–99)
Potassium: 3.3 mmol/L — ABNORMAL LOW (ref 3.5–5.1)
Sodium: 137 mmol/L (ref 135–145)
Total Bilirubin: 0.9 mg/dL (ref 0.3–1.2)
Total Protein: 6.9 g/dL (ref 6.5–8.1)

## 2019-01-01 LAB — CBC
HCT: 45.1 % (ref 39.0–52.0)
Hemoglobin: 13.9 g/dL (ref 13.0–17.0)
MCH: 22.9 pg — ABNORMAL LOW (ref 26.0–34.0)
MCHC: 30.8 g/dL (ref 30.0–36.0)
MCV: 74.4 fL — ABNORMAL LOW (ref 80.0–100.0)
Platelets: 236 10*3/uL (ref 150–400)
RBC: 6.06 MIL/uL — ABNORMAL HIGH (ref 4.22–5.81)
RDW: 16 % — ABNORMAL HIGH (ref 11.5–15.5)
WBC: 9.9 10*3/uL (ref 4.0–10.5)
nRBC: 0 % (ref 0.0–0.2)

## 2019-01-01 LAB — LIPASE, BLOOD: Lipase: 30 U/L (ref 11–51)

## 2019-01-01 MED ORDER — SODIUM CHLORIDE 0.9% FLUSH: 3.0000 mL | Freq: Once | INTRAVENOUS | Status: DC

## 2019-01-01 NOTE — ED Triage Notes (Signed)
Pt went to [facility] for abd pain on 5/26. Went onto his chart today and noted that his covid test was +. Is here today due to still having abd discomfort. Denies fever or other symptoms. Mask on pt on arrival.

## 2019-01-01 NOTE — ED Provider Notes (Signed)
[facility] EMERGENCY DEPARTMENT Provider Note   CSN: [id number] Arrival date & time: 01/01/19  2123    History   Chief Complaint Chief Complaint  Patient presents with  . Abdominal Pain    HPI Mitchell Rush is a 84 y.o. male.   The history is provided by the patient.  He has history of hypertension, diabetes, hyperlipidemia COPD, coronary artery disease, chronic kidney disease, abdominal aortic aneurysm and comes in complaining of ongoing right lower quadrant pain.  He has had pain in the right lower quadrant for about the last week.  He rates pain at 8/10.  Pain is sharp and is not affected by anything that he does.  Is not affected by eating, urinating, bowel movements, movement.  He had been seen at [facility] and states that nothing was done for him.  He was seen at [facility] where he was given IV fluids.  He received a letter from them later stating that his COVID-19 test was positive.  He has had some mild nausea but has not vomited.  He denies diarrhea, but on further testing, he actually had diarrhea for about a week before the onset of his abdominal pain.  He is not taking anything for the pain.  Past Medical History:  Diagnosis Date  . AAA (abdominal aortic aneurysm) without rupture ([place])   . Barrett's esophagus    hx of  . CAD (coronary artery disease)   . CKD (chronic kidney disease)    stage 2  . COPD (chronic obstructive pulmonary disease) ([place])   . Depression   . Diabetes ([place])   . Gastric ulcer    acute  . GERD (gastroesophageal reflux disease)   . History of colon polyps   . Hypertension   . Memory loss   . Mixed hyperlipidemia   . Osteopenia    osteoporosis  . Sleep apnea   . Thoracic aortic aneurysm (TAA) ([place])   . Vitamin D deficiency     Patient Active Problem List   Diagnosis Date Noted  . Memory impairment   . AKI (acute kidney injury) ([place])   . Elevated troponin 11/02/2018  . Chest pain  11/02/2018  . GERD (gastroesophageal reflux disease) 11/02/2018  . Barrett's esophagus   . Gastric ulcer   . Chronic kidney disease (CKD), stage II (mild)   . COPD (chronic obstructive pulmonary disease) ([place])   . Depression   . Hypercholesteremia   . Hypertension     Past Surgical History:  Procedure Laterality Date  . BACK SURGERY  10/2015   lumbar laminectomy  . CATARACT EXTRACTION Left   . HEMICOLECTOMY          Home Medications    Prior to Admission medications   Medication Sig Start Date End Date Taking? Authorizing Provider  amLODipine (NORVASC) 5 MG tablet Take 5 mg by mouth daily.    [provider]  aspirin EC 81 MG tablet Take 81 mg by mouth daily.    [provider]  atorvastatin (LIPITOR) 20 MG tablet Take 20 mg by mouth daily.    [provider]  clotrimazole-betamethasone (LOTRISONE) cream Apply 1 application topically 2 (two) times daily.    [provider]  cyclobenzaprine (FLEXERIL) 10 MG tablet Take 10 mg by mouth 2 (two) times daily.    [provider]  diclofenac sodium (VOLTAREN) 1 % GEL Apply topically 4 (four) times daily.    [provider]  feeding  supplement, GLUCERNA SHAKE, (GLUCERNA SHAKE) LIQD Take 237 mLs by mouth 3 (three) times daily between meals. 11/04/18   Florencia Reasons, MD  lisinopril (PRINIVIL,ZESTRIL) 40 MG tablet Take 0.5 tablets (20 mg total) by mouth daily. 11/04/18   Florencia Reasons, MD  Multiple Vitamin (MULTIVITAMIN) tablet Take 1 tablet by mouth daily.    [provider]  nitroGLYCERIN (NITROSTAT) 0.4 MG SL tablet Place 1 tablet (0.4 mg total) under the tongue every 5 (five) minutes as needed for chest pain. 11/04/18   Florencia Reasons, MD  omeprazole (PRILOSEC) 40 MG capsule Take 40 mg by mouth 2 (two) times daily.    [provider]  polyethylene glycol (MIRALAX / GLYCOLAX) packet Take 17 g by mouth daily.    [provider]  sertraline (ZOLOFT) 50 MG tablet Take 50 mg by mouth  daily.    [provider]  sucralfate (CARAFATE) 1 g tablet Take 1 g by mouth 4 (four) times a week. 4 x daily    [provider]  terazosin (HYTRIN) 1 MG capsule Take 1 mg by mouth at bedtime.    [provider]  topiramate (TOPAMAX) 25 MG tablet Take 25 mg by mouth at bedtime.  08/16/18   [provider]  traZODone (DESYREL) 50 MG tablet Take 50 mg by mouth 2 (two) times daily.     [provider]    Family History Family History  Problem Relation Age of Onset  . Heart disease Mother   . Hypertension Sister   . Diabetes Mellitus II Sister   . Diabetes Mellitus II Brother   . Cancer Brother     Social History Social History   Tobacco Use  . Smoking status: Former Smoker    Packs/day: 0.50    Types: Cigarettes  . Smokeless tobacco: Never Used  Substance Use Topics  . Alcohol use: Never    Frequency: Never  . Drug use: Never     Allergies   Patient has no known allergies.   Review of Systems Review of Systems  All other systems reviewed and are negative.    Physical Exam Updated Vital Signs BP 138/69 (BP Location: Left Arm)   Pulse 68   Temp (!) 97.1 F (36.2 C) (Oral)   Resp 16   SpO2 100%   Physical Exam Vitals signs and nursing note reviewed.    84 year old male, resting comfortably and in no acute distress. Vital signs are normal. Oxygen saturation is 100%, which is normal. Head is normocephalic and atraumatic. PERRLA, EOMI. Oropharynx is clear. Neck is nontender and supple without adenopathy or JVD. Back is nontender and there is no CVA tenderness. Lungs are clear without rales, wheezes, or rhonchi. Chest is nontender. Heart has regular rate and rhythm without murmur. Abdomen is soft, flat, with some mild right lower quadrant tenderness.  There is no rebound or guarding.  There are no masses or hepatosplenomegaly and peristalsis is normoactive. Extremities have no cyanosis or edema, full range of motion is  present. Skin is warm and dry without rash. Neurologic: Mental status is normal, cranial nerves are intact, there are no motor or sensory deficits.  ED Treatments / Results  Labs (all labs ordered are listed, but only abnormal results are displayed) Labs Reviewed  COMPREHENSIVE METABOLIC PANEL - Abnormal; Notable for the following components:      Result Value   Potassium 3.3 (*)    Glucose, Bld 126 (*)    All other components within normal  limits  CBC - Abnormal; Notable for the following components:   RBC 6.06 (*)    MCV 74.4 (*)    MCH 22.9 (*)    RDW 16.0 (*)    All other components within normal limits  LIPASE, BLOOD  URINALYSIS, ROUTINE W REFLEX MICROSCOPIC   Radiology No results found.  Procedures Procedures   Medications Ordered in ED Medications  sodium chloride flush (NS) 0.9 % injection 3 mL (3 mLs Intravenous Not Given 01/01/19 2330)  potassium chloride SA (K-DUR) CR tablet 40 mEq (40 mEq Oral Given 01/02/19 0039)  sodium chloride 0.9 % bolus 1,000 mL (0 mLs Intravenous Stopped 01/02/19 0439)  ketorolac (TORADOL) 15 MG/ML injection 15 mg (15 mg Intravenous Given 01/02/19 0047)  iohexol (OMNIPAQUE) 300 MG/ML solution 100 mL (100 mLs Intravenous Contrast Given 01/02/19 0107)     Initial Impression / Assessment and Plan / ED Course  I have reviewed the triage vital signs and the nursing notes.  Pertinent labs & imaging results that were available during my care of the patient were reviewed by me and considered in my medical decision making (see chart for details).  Right lower quadrant pain of uncertain cause.  Exam is benign.  Old records were reviewed, and he actually was admitted at [facility] overnight and CT scan at that time showed a 4.7 cm infrarenal aortic aneurysm which was unchanged from prior.  His son showed me the report from [facility], and his COVID-19 test was actually an antibody test.  On further questioning, he states he  had the flu about a month ago.  In retrospect, that was probably COVID-19 and he has successfully recovered.  He would not be considered contagious at this point.  Labs today are unremarkable.  There is mild hypokalemia and is given a dose of oral potassium.  WBC is 9.9.  Because of ongoing pain, will repeat CT scan.  In spite of history of chronic kidney disease, creatinine is noted to be normal with GFR greater than 60.  We will try low-dose ketorolac to see if he gets pain relief.  He got good relief of pain with ketorolac.  Labs are unremarkable other than mild hypokalemia, and he was given a dose of oral potassium.  CT scan confirmed infrarenal aortic aneurysm, no acute finding.  Patient was advised of these findings.  It is noted that he has been treated with proton pump inhibitors and sucralfate in the past, so decision is made to give him a prescription for celecoxib for pain.  This avoids using narcotics, but avoids giving him things that are likely to be irritating to the stomach.  He is encouraged to follow-up with vascular surgery, follow-up with primary care provider in 1 week.  Return precautions discussed.  Wrangler Penning was evaluated in Emergency Department on 01/02/2019 for the symptoms described in the history of present illness. He was evaluated in the context of the global COVID-19 pandemic, which necessitated consideration that the patient might be at risk for infection with the SARS-CoV-2 virus that causes COVID-19. Institutional protocols and algorithms that pertain to the evaluation of patients at risk for COVID-19 are in a state of rapid change based on information released by regulatory bodies including the CDC and federal and state organizations. These policies and algorithms were followed during the patient's care in the ED.   Final Clinical Impressions(s) / ED Diagnoses   Final diagnoses:  Right lower quadrant abdominal pain  Abdominal aortic  aneurysm (AAA) 3.0 cm to 5.0 cm in  diameter in male [facility])  Hypokalemia    ED Discharge Orders         Ordered    celecoxib (CELEBREX) 100 MG capsule  2 times daily     01/02/19 0258           Delora Fuel, MD 52/77/82 [phone]

## 2019-01-02 ENCOUNTER — Emergency Department (HOSPITAL_COMMUNITY): Payer: Medicare Other

## 2019-01-02 DIAGNOSIS — R1031 Right lower quadrant pain: Secondary | ICD-10-CM | POA: Diagnosis not present

## 2019-01-02 LAB — URINALYSIS, ROUTINE W REFLEX MICROSCOPIC
Bilirubin Urine: NEGATIVE
Glucose, UA: NEGATIVE mg/dL
Hgb urine dipstick: NEGATIVE
Ketones, ur: NEGATIVE mg/dL
Leukocytes,Ua: NEGATIVE
Nitrite: NEGATIVE
Protein, ur: NEGATIVE mg/dL
Specific Gravity, Urine: 1.03 (ref 1.005–1.030)
pH: 7 (ref 5.0–8.0)

## 2019-01-02 MED ORDER — SODIUM CHLORIDE FLUSH 0.9 % IV SOLN
10.00 | INTRAVENOUS | Status: DC
Start: 2018-12-30 — End: 2019-01-02

## 2019-01-02 MED ORDER — ACETAMINOPHEN 325 MG PO TABS
650.00 | ORAL_TABLET | ORAL | Status: DC
Start: ? — End: 2019-01-02

## 2019-01-02 MED ORDER — SODIUM CHLORIDE 0.9 % IV BOLUS
1000.0000 mL | Freq: Once | INTRAVENOUS | Status: AC
  Administered 2019-01-02: 1000 mL via INTRAVENOUS

## 2019-01-02 MED ORDER — KETOROLAC TROMETHAMINE 15 MG/ML IJ SOLN
15.0000 mg | Freq: Once | INTRAMUSCULAR | Status: AC
  Administered 2019-01-02: 15 mg via INTRAVENOUS
  Filled 2019-01-02: qty 1

## 2019-01-02 MED ORDER — SODIUM CHLORIDE FLUSH 0.9 % IV SOLN
10.00 | INTRAVENOUS | Status: DC
Start: ? — End: 2019-01-02

## 2019-01-02 MED ORDER — IOHEXOL 300 MG/ML  SOLN
100.0000 mL | Freq: Once | INTRAMUSCULAR | Status: AC | PRN
  Administered 2019-01-02: 01:00:00 100 mL via INTRAVENOUS

## 2019-01-02 MED ORDER — CELECOXIB 100 MG PO CAPS: 100.0000 mg | ORAL_CAPSULE | Freq: Two times a day (BID) | ORAL | 0 refills | Status: DC

## 2019-01-02 MED ORDER — IOHEXOL 350 MG/ML SOLN
100.00 | INTRAVENOUS | Status: DC
Start: ? — End: 2019-01-02

## 2019-01-02 MED ORDER — POTASSIUM CHLORIDE CRYS ER 20 MEQ PO TBCR
40.0000 meq | EXTENDED_RELEASE_TABLET | Freq: Once | ORAL | Status: AC
  Administered 2019-01-02: 40 meq via ORAL
  Filled 2019-01-02: qty 2

## 2019-01-02 NOTE — Discharge Instructions (Addendum)
Your evaluation did not show a cause for your abdominal pain, but there is no sign of any serious problems.  If pain is getting worse, or if you start running a fever or start vomiting, then return to the emergency department for additional evaluation.  Otherwise, plan to follow-up with your primary care provider in about 1 week.  Your CAT scan shows you have an aneurysm in your aorta.  An aneurysm is an area of the blood vessel that is enlarged and somewhat weakened.  This needs to be followed with periodic CAT scans or ultrasounds to see if it is growing.  Usually, no interventions are done until it reaches 5.5 cm.  Please make an appointment with the vascular surgeon, who can evaluate your CAT scan and decide what additional testing needs to be done, and when to do it.  The blood test that you had from [facility] shows that you have an antibody to the coronavirus.  This means that you previously had an infection, and have developed immunity to it.  You do not need to quarantine, because you are not contagious.

## 2019-01-11 ENCOUNTER — Encounter: Payer: Self-pay | Admitting: *Deleted

## 2019-01-15 ENCOUNTER — Ambulatory Visit: Payer: Medicare Other | Admitting: Neurology

## 2019-01-15 ENCOUNTER — Telehealth: Payer: Self-pay | Admitting: *Deleted

## 2019-01-15 NOTE — Telephone Encounter (Signed)
Patient reported car trouble and unable to make appt today.  He is going out of town in a few days and will call back to reschedule.

## 2019-02-02 ENCOUNTER — Encounter (HOSPITAL_BASED_OUTPATIENT_CLINIC_OR_DEPARTMENT_OTHER): Payer: Self-pay

## 2019-02-02 ENCOUNTER — Other Ambulatory Visit: Payer: Self-pay

## 2019-02-02 ENCOUNTER — Emergency Department (HOSPITAL_BASED_OUTPATIENT_CLINIC_OR_DEPARTMENT_OTHER)
Admission: EM | Admit: 2019-02-02 | Discharge: 2019-02-02 | Disposition: A | Discharge status: Home / Self Care | Payer: Medicare Other | Attending: Emergency Medicine | Admitting: Emergency Medicine

## 2019-02-02 DIAGNOSIS — I2510 Atherosclerotic heart disease of native coronary artery without angina pectoris: Secondary | ICD-10-CM | POA: Insufficient documentation

## 2019-02-02 DIAGNOSIS — W458XXA Other foreign body or object entering through skin, initial encounter: Secondary | ICD-10-CM | POA: Insufficient documentation

## 2019-02-02 DIAGNOSIS — N182 Chronic kidney disease, stage 2 (mild): Secondary | ICD-10-CM | POA: Insufficient documentation

## 2019-02-02 DIAGNOSIS — Z79899 Other long term (current) drug therapy: Secondary | ICD-10-CM | POA: Insufficient documentation

## 2019-02-02 DIAGNOSIS — Y929 Unspecified place or not applicable: Secondary | ICD-10-CM | POA: Diagnosis not present

## 2019-02-02 DIAGNOSIS — S6991XA Unspecified injury of right wrist, hand and finger(s), initial encounter: Secondary | ICD-10-CM | POA: Diagnosis not present

## 2019-02-02 DIAGNOSIS — I129 Hypertensive chronic kidney disease with stage 1 through stage 4 chronic kidney disease, or unspecified chronic kidney disease: Secondary | ICD-10-CM | POA: Diagnosis not present

## 2019-02-02 DIAGNOSIS — Z87891 Personal history of nicotine dependence: Secondary | ICD-10-CM | POA: Insufficient documentation

## 2019-02-02 DIAGNOSIS — Y9389 Activity, other specified: Secondary | ICD-10-CM | POA: Diagnosis not present

## 2019-02-02 DIAGNOSIS — J449 Chronic obstructive pulmonary disease, unspecified: Secondary | ICD-10-CM | POA: Diagnosis not present

## 2019-02-02 DIAGNOSIS — Y998 Other external cause status: Secondary | ICD-10-CM | POA: Diagnosis not present

## 2019-02-02 DIAGNOSIS — Z7982 Long term (current) use of aspirin: Secondary | ICD-10-CM | POA: Diagnosis not present

## 2019-02-02 DIAGNOSIS — E1122 Type 2 diabetes mellitus with diabetic chronic kidney disease: Secondary | ICD-10-CM | POA: Diagnosis not present

## 2019-02-02 MED ORDER — LIDOCAINE HCL 2 % IJ SOLN
20.0000 mL | Freq: Once | INTRAMUSCULAR | Status: AC
  Administered 2019-02-02: 21:00:00 400 mg

## 2019-02-02 NOTE — ED Triage Notes (Signed)
Fish hook to right hand x 1 hour-NAD-steady gait

## 2019-02-02 NOTE — Discharge Instructions (Addendum)
Watch for signs of infection such as spreading redness, pus draining Keep wound clean with soap and water. Tylenol for pain.
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
## 2019-10-24 ENCOUNTER — Other Ambulatory Visit: Payer: Self-pay

## 2019-10-24 ENCOUNTER — Telehealth: Payer: Self-pay | Admitting: Vascular Surgery

## 2019-10-24 ENCOUNTER — Emergency Department (HOSPITAL_COMMUNITY): Payer: Medicare HMO

## 2019-10-24 ENCOUNTER — Emergency Department (HOSPITAL_COMMUNITY)
Admission: EM | Admit: 2019-10-24 | Discharge: 2019-10-24 | Disposition: A | Discharge status: Home / Self Care | Payer: Medicare HMO | Attending: Emergency Medicine | Admitting: Emergency Medicine

## 2019-10-24 DIAGNOSIS — I716 Thoracoabdominal aortic aneurysm, without rupture, unspecified: Secondary | ICD-10-CM

## 2019-10-24 DIAGNOSIS — Z9049 Acquired absence of other specified parts of digestive tract: Secondary | ICD-10-CM | POA: Insufficient documentation

## 2019-10-24 DIAGNOSIS — Z87891 Personal history of nicotine dependence: Secondary | ICD-10-CM | POA: Insufficient documentation

## 2019-10-24 DIAGNOSIS — I2510 Atherosclerotic heart disease of native coronary artery without angina pectoris: Secondary | ICD-10-CM | POA: Diagnosis not present

## 2019-10-24 DIAGNOSIS — J449 Chronic obstructive pulmonary disease, unspecified: Secondary | ICD-10-CM | POA: Diagnosis not present

## 2019-10-24 DIAGNOSIS — I129 Hypertensive chronic kidney disease with stage 1 through stage 4 chronic kidney disease, or unspecified chronic kidney disease: Secondary | ICD-10-CM | POA: Insufficient documentation

## 2019-10-24 DIAGNOSIS — Z7982 Long term (current) use of aspirin: Secondary | ICD-10-CM | POA: Insufficient documentation

## 2019-10-24 DIAGNOSIS — R0602 Shortness of breath: Secondary | ICD-10-CM | POA: Diagnosis present

## 2019-10-24 DIAGNOSIS — Z79899 Other long term (current) drug therapy: Secondary | ICD-10-CM | POA: Insufficient documentation

## 2019-10-24 DIAGNOSIS — E1122 Type 2 diabetes mellitus with diabetic chronic kidney disease: Secondary | ICD-10-CM | POA: Diagnosis not present

## 2019-10-24 DIAGNOSIS — N182 Chronic kidney disease, stage 2 (mild): Secondary | ICD-10-CM | POA: Diagnosis not present

## 2019-10-24 LAB — CBC WITH DIFFERENTIAL/PLATELET
Abs Immature Granulocytes: 0.02 10*3/uL (ref 0.00–0.07)
Basophils Absolute: 0.1 10*3/uL (ref 0.0–0.1)
Basophils Relative: 1 %
Eosinophils Absolute: 0.5 10*3/uL (ref 0.0–0.5)
Eosinophils Relative: 8 %
HCT: 43.4 % (ref 39.0–52.0)
Hemoglobin: 13.4 g/dL (ref 13.0–17.0)
Immature Granulocytes: 0 %
Lymphocytes Relative: 23 %
Lymphs Abs: 1.4 10*3/uL (ref 0.7–4.0)
MCH: 22.9 pg — ABNORMAL LOW (ref 26.0–34.0)
MCHC: 30.9 g/dL (ref 30.0–36.0)
MCV: 74.2 fL — ABNORMAL LOW (ref 80.0–100.0)
Monocytes Absolute: 0.6 10*3/uL (ref 0.1–1.0)
Monocytes Relative: 10 %
Neutro Abs: 3.4 10*3/uL (ref 1.7–7.7)
Neutrophils Relative %: 58 %
Platelets: 251 10*3/uL (ref 150–400)
RBC: 5.85 MIL/uL — ABNORMAL HIGH (ref 4.22–5.81)
RDW: 15.7 % — ABNORMAL HIGH (ref 11.5–15.5)
WBC: 5.9 10*3/uL (ref 4.0–10.5)
nRBC: 0 % (ref 0.0–0.2)

## 2019-10-24 LAB — COMPREHENSIVE METABOLIC PANEL
ALT: 23 U/L (ref 0–44)
AST: 33 U/L (ref 15–41)
Albumin: 3.6 g/dL (ref 3.5–5.0)
Alkaline Phosphatase: 57 U/L (ref 38–126)
Anion gap: 11 (ref 5–15)
BUN: 9 mg/dL (ref 8–23)
CO2: 28 mmol/L (ref 22–32)
Calcium: 8.7 mg/dL — ABNORMAL LOW (ref 8.9–10.3)
Chloride: 100 mmol/L (ref 98–111)
Creatinine, Ser: 1.18 mg/dL (ref 0.61–1.24)
GFR calc Af Amer: >60 mL/min (ref >60)
GFR calc non Af Amer: 57 mL/min — ABNORMAL LOW (ref >60)
Glucose, Bld: 131 mg/dL — ABNORMAL HIGH (ref 70–99)
Potassium: 3.5 mmol/L (ref 3.5–5.1)
Sodium: 139 mmol/L (ref 135–145)
Total Bilirubin: 0.7 mg/dL (ref 0.3–1.2)
Total Protein: 6.6 g/dL (ref 6.5–8.1)

## 2019-10-24 LAB — I-STAT CHEM 8, ED
BUN: 7 mg/dL — ABNORMAL LOW (ref 8–23)
Calcium, Ion: 0.87 mmol/L — CL (ref 1.15–1.40)
Chloride: 110 mmol/L (ref 98–111)
Creatinine, Ser: 0.6 mg/dL — ABNORMAL LOW (ref 0.61–1.24)
Glucose, Bld: 88 mg/dL (ref 70–99)
HCT: 30 % — ABNORMAL LOW (ref 39.0–52.0)
Hemoglobin: 10.2 g/dL — ABNORMAL LOW (ref 13.0–17.0)
Potassium: 2.1 mmol/L — CL (ref 3.5–5.1)
Sodium: 145 mmol/L (ref 135–145)
TCO2: 22 mmol/L (ref 22–32)

## 2019-10-24 LAB — TROPONIN I (HIGH SENSITIVITY)
Troponin I (High Sensitivity): 30 ng/L — ABNORMAL HIGH (ref <18)
Troponin I (High Sensitivity): 29 ng/L — ABNORMAL HIGH (ref <18)

## 2019-10-24 LAB — MAGNESIUM: Magnesium: 1.5 mg/dL — ABNORMAL LOW (ref 1.7–2.4)

## 2019-10-24 MED ORDER — IOHEXOL 350 MG/ML SOLN
100.0000 mL | Freq: Once | INTRAVENOUS | Status: AC | PRN
  Administered 2019-10-24: 11:00:00 100 mL via INTRAVENOUS

## 2019-10-24 MED ORDER — SODIUM CHLORIDE 0.9 % IV BOLUS
1000.0000 mL | Freq: Once | INTRAVENOUS | Status: AC
  Administered 2019-10-24: 10:00:00 1000 mL via INTRAVENOUS

## 2019-10-24 MED ORDER — MAGNESIUM OXIDE 400 (241.3 MG) MG PO TABS
400.0000 mg | ORAL_TABLET | Freq: Once | ORAL | Status: AC
  Administered 2019-10-24: 400 mg via ORAL
  Filled 2019-10-24: qty 1

## 2019-10-24 MED ORDER — POTASSIUM CHLORIDE CRYS ER 20 MEQ PO TBCR
40.0000 meq | EXTENDED_RELEASE_TABLET | Freq: Once | ORAL | Status: AC
  Administered 2019-10-24: 10:00:00 40 meq via ORAL
  Filled 2019-10-24: qty 2

## 2019-10-24 MED ORDER — POTASSIUM CHLORIDE 10 MEQ/100ML IV SOLN
10.0000 meq | INTRAVENOUS | Status: DC
  Filled 2019-10-24: qty 100

## 2019-10-24 NOTE — Telephone Encounter (Signed)
Called by the ER about this patient.  He needs an appointment next 2-4 weeks to talk about AAA.  April 14 [place]
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
## 2019-10-24 NOTE — Discharge Instructions (Signed)
Your aneurysm is slightly larger. Dr. Nona Dell office will contact you for appointment   See your doctor as well   Return to ER if you have worse chest pain, abdominal pain, vomiting.

## 2019-10-24 NOTE — ED Triage Notes (Addendum)
Pt arrives via [facility] with complaints of SOB. Per EMS pt began to feel a "squeezing" in his chest 3 days ago. Since then he has SOB which comes and goes. Pt states he felt more SOB today for a longer period of time so he called EMS. Pt alert and oriented X4  Pt given 324 asa en route 150/90 P:70 RR 16 99% RA

## 2019-10-26 ENCOUNTER — Other Ambulatory Visit: Payer: Self-pay

## 2019-10-26 ENCOUNTER — Observation Stay (HOSPITAL_COMMUNITY)
Admission: EM | Admit: 2019-10-26 | Discharge: 2019-10-27 | Disposition: A | Discharge status: Home / Self Care | Payer: Medicare HMO | Attending: Student in an Organized Health Care Education/Training Program | Admitting: Student in an Organized Health Care Education/Training Program

## 2019-10-26 ENCOUNTER — Encounter (HOSPITAL_COMMUNITY): Payer: Self-pay | Admitting: Student in an Organized Health Care Education/Training Program

## 2019-10-26 ENCOUNTER — Encounter (HOSPITAL_COMMUNITY)
Admission: EM | Disposition: A | Discharge status: Home / Self Care | Payer: Self-pay | Source: Home / Self Care | Attending: Emergency Medicine

## 2019-10-26 ENCOUNTER — Emergency Department (HOSPITAL_COMMUNITY): Payer: Medicare HMO

## 2019-10-26 DIAGNOSIS — G4730 Sleep apnea, unspecified: Secondary | ICD-10-CM | POA: Insufficient documentation

## 2019-10-26 DIAGNOSIS — E1122 Type 2 diabetes mellitus with diabetic chronic kidney disease: Secondary | ICD-10-CM | POA: Insufficient documentation

## 2019-10-26 DIAGNOSIS — F329 Major depressive disorder, single episode, unspecified: Secondary | ICD-10-CM | POA: Insufficient documentation

## 2019-10-26 DIAGNOSIS — C61 Malignant neoplasm of prostate: Secondary | ICD-10-CM

## 2019-10-26 DIAGNOSIS — K219 Gastro-esophageal reflux disease without esophagitis: Secondary | ICD-10-CM | POA: Diagnosis not present

## 2019-10-26 DIAGNOSIS — Z20822 Contact with and (suspected) exposure to covid-19: Secondary | ICD-10-CM | POA: Diagnosis not present

## 2019-10-26 DIAGNOSIS — E119 Type 2 diabetes mellitus without complications: Secondary | ICD-10-CM | POA: Diagnosis not present

## 2019-10-26 DIAGNOSIS — Z87891 Personal history of nicotine dependence: Secondary | ICD-10-CM

## 2019-10-26 DIAGNOSIS — R7303 Prediabetes: Secondary | ICD-10-CM

## 2019-10-26 DIAGNOSIS — I129 Hypertensive chronic kidney disease with stage 1 through stage 4 chronic kidney disease, or unspecified chronic kidney disease: Secondary | ICD-10-CM | POA: Insufficient documentation

## 2019-10-26 DIAGNOSIS — I2510 Atherosclerotic heart disease of native coronary artery without angina pectoris: Secondary | ICD-10-CM | POA: Insufficient documentation

## 2019-10-26 DIAGNOSIS — E785 Hyperlipidemia, unspecified: Secondary | ICD-10-CM | POA: Diagnosis not present

## 2019-10-26 DIAGNOSIS — R0602 Shortness of breath: Secondary | ICD-10-CM

## 2019-10-26 DIAGNOSIS — E559 Vitamin D deficiency, unspecified: Secondary | ICD-10-CM | POA: Insufficient documentation

## 2019-10-26 DIAGNOSIS — J449 Chronic obstructive pulmonary disease, unspecified: Secondary | ICD-10-CM | POA: Insufficient documentation

## 2019-10-26 DIAGNOSIS — I714 Abdominal aortic aneurysm, without rupture: Secondary | ICD-10-CM | POA: Diagnosis not present

## 2019-10-26 DIAGNOSIS — Z79899 Other long term (current) drug therapy: Secondary | ICD-10-CM

## 2019-10-26 DIAGNOSIS — I712 Thoracic aortic aneurysm, without rupture: Secondary | ICD-10-CM | POA: Diagnosis not present

## 2019-10-26 DIAGNOSIS — R0789 Other chest pain: Secondary | ICD-10-CM | POA: Diagnosis not present

## 2019-10-26 DIAGNOSIS — N183 Chronic kidney disease, stage 3 unspecified: Secondary | ICD-10-CM | POA: Insufficient documentation

## 2019-10-26 DIAGNOSIS — E7800 Pure hypercholesterolemia, unspecified: Secondary | ICD-10-CM | POA: Insufficient documentation

## 2019-10-26 DIAGNOSIS — I10 Essential (primary) hypertension: Secondary | ICD-10-CM | POA: Diagnosis not present

## 2019-10-26 DIAGNOSIS — R079 Chest pain, unspecified: Secondary | ICD-10-CM | POA: Diagnosis present

## 2019-10-26 DIAGNOSIS — R072 Precordial pain: Secondary | ICD-10-CM

## 2019-10-26 HISTORY — PX: LEFT HEART CATH AND CORONARY ANGIOGRAPHY: CATH118249

## 2019-10-26 HISTORY — DX: Family history of other specified conditions: Z84.89

## 2019-10-26 HISTORY — PX: CARDIAC CATHETERIZATION: SHX172

## 2019-10-26 LAB — CBC WITH DIFFERENTIAL/PLATELET
Abs Immature Granulocytes: 0.01 10*3/uL (ref 0.00–0.07)
Abs Immature Granulocytes: 0.01 10*3/uL (ref 0.00–0.07)
Basophils Absolute: 0.1 10*3/uL (ref 0.0–0.1)
Basophils Absolute: 0.1 10*3/uL (ref 0.0–0.1)
Basophils Relative: 1 %
Basophils Relative: 2 %
Eosinophils Absolute: 0.6 10*3/uL — ABNORMAL HIGH (ref 0.0–0.5)
Eosinophils Absolute: 0.6 10*3/uL — ABNORMAL HIGH (ref 0.0–0.5)
Eosinophils Relative: 10 %
Eosinophils Relative: 11 %
HCT: 42.3 % (ref 39.0–52.0)
HCT: 43.3 % (ref 39.0–52.0)
Hemoglobin: 13 g/dL (ref 13.0–17.0)
Hemoglobin: 13.8 g/dL (ref 13.0–17.0)
Immature Granulocytes: 0 %
Immature Granulocytes: 0 %
Lymphocytes Relative: 23 %
Lymphocytes Relative: 29 %
Lymphs Abs: 1.2 10*3/uL (ref 0.7–4.0)
Lymphs Abs: 1.6 10*3/uL (ref 0.7–4.0)
MCH: 22.8 pg — ABNORMAL LOW (ref 26.0–34.0)
MCH: 23.3 pg — ABNORMAL LOW (ref 26.0–34.0)
MCHC: 30.7 g/dL (ref 30.0–36.0)
MCHC: 31.9 g/dL (ref 30.0–36.0)
MCV: 74.2 fL — ABNORMAL LOW (ref 80.0–100.0)
MCV: 73 fL — ABNORMAL LOW (ref 80.0–100.0)
Monocytes Absolute: 0.5 10*3/uL (ref 0.1–1.0)
Monocytes Absolute: 0.5 10*3/uL (ref 0.1–1.0)
Monocytes Relative: 9 %
Monocytes Relative: 8 %
Neutro Abs: 3.1 10*3/uL (ref 1.7–7.7)
Neutro Abs: 2.7 10*3/uL (ref 1.7–7.7)
Neutrophils Relative %: 57 %
Neutrophils Relative %: 50 %
Platelets: 237 10*3/uL (ref 150–400)
Platelets: 248 10*3/uL (ref 150–400)
RBC: 5.7 MIL/uL (ref 4.22–5.81)
RBC: 5.93 MIL/uL — ABNORMAL HIGH (ref 4.22–5.81)
RDW: 15.8 % — ABNORMAL HIGH (ref 11.5–15.5)
RDW: 15.7 % — ABNORMAL HIGH (ref 11.5–15.5)
WBC: 5.4 10*3/uL (ref 4.0–10.5)
WBC: 5.4 10*3/uL (ref 4.0–10.5)
nRBC: 0 % (ref 0.0–0.2)
nRBC: 0 % (ref 0.0–0.2)

## 2019-10-26 LAB — BASIC METABOLIC PANEL
Anion gap: 12 (ref 5–15)
BUN: 10 mg/dL (ref 8–23)
CO2: 26 mmol/L (ref 22–32)
Calcium: 8.7 mg/dL — ABNORMAL LOW (ref 8.9–10.3)
Chloride: 100 mmol/L (ref 98–111)
Creatinine, Ser: 1.19 mg/dL (ref 0.61–1.24)
GFR calc Af Amer: >60 mL/min (ref >60)
GFR calc non Af Amer: 57 mL/min — ABNORMAL LOW (ref >60)
Glucose, Bld: 215 mg/dL — ABNORMAL HIGH (ref 70–99)
Potassium: 3.1 mmol/L — ABNORMAL LOW (ref 3.5–5.1)
Sodium: 138 mmol/L (ref 135–145)

## 2019-10-26 LAB — HEMOGLOBIN A1C
Hgb A1c MFr Bld: 6.6 % — ABNORMAL HIGH (ref 4.8–5.6)
Mean Plasma Glucose: 142.72 mg/dL

## 2019-10-26 LAB — FERRITIN: Ferritin: 83 ng/mL (ref 24–336)

## 2019-10-26 LAB — IRON AND TIBC
Iron: 126 ug/dL (ref 45–182)
Saturation Ratios: 41 % — ABNORMAL HIGH (ref 17.9–39.5)
TIBC: 305 ug/dL (ref 250–450)
UIBC: 179 ug/dL

## 2019-10-26 LAB — TROPONIN I (HIGH SENSITIVITY)
Troponin I (High Sensitivity): 40 ng/L — ABNORMAL HIGH (ref <18)
Troponin I (High Sensitivity): 40 ng/L — ABNORMAL HIGH (ref <18)

## 2019-10-26 LAB — LIPID PANEL
Cholesterol: 167 mg/dL (ref 0–200)
HDL: 45 mg/dL (ref >40)
LDL Cholesterol: 102 mg/dL — ABNORMAL HIGH (ref 0–99)
Total CHOL/HDL Ratio: 3.7 RATIO
Triglycerides: 99 mg/dL (ref <150)
VLDL: 20 mg/dL (ref 0–40)

## 2019-10-26 LAB — BRAIN NATRIURETIC PEPTIDE: B Natriuretic Peptide: 37.3 pg/mL (ref 0.0–100.0)

## 2019-10-26 LAB — SARS CORONAVIRUS 2 (TAT 6-24 HRS): SARS Coronavirus 2: NEGATIVE

## 2019-10-26 LAB — TSH: TSH: 2.047 u[IU]/mL (ref 0.350–4.500)

## 2019-10-26 SURGERY — LEFT HEART CATH AND CORONARY ANGIOGRAPHY
Anesthesia: LOCAL

## 2019-10-26 MED ORDER — ACETAMINOPHEN 650 MG RE SUPP: 650.0000 mg | Freq: Four times a day (QID) | RECTAL | Status: DC | PRN

## 2019-10-26 MED ORDER — SODIUM CHLORIDE 0.9% FLUSH
3.0000 mL | Freq: Two times a day (BID) | INTRAVENOUS | Status: DC
  Administered 2019-10-27: 3 mL via INTRAVENOUS

## 2019-10-26 MED ORDER — SODIUM CHLORIDE 0.9% FLUSH: 3.0000 mL | INTRAVENOUS | Status: DC | PRN

## 2019-10-26 MED ORDER — TRAZODONE HCL 100 MG PO TABS
100.0000 mg | ORAL_TABLET | Freq: Every day | ORAL | Status: DC
  Administered 2019-10-26: 100 mg via ORAL
  Filled 2019-10-26: qty 1

## 2019-10-26 MED ORDER — ENOXAPARIN SODIUM 40 MG/0.4ML ~~LOC~~ SOLN
40.0000 mg | SUBCUTANEOUS | Status: DC
  Filled 2019-10-26: qty 0.4

## 2019-10-26 MED ORDER — LIDOCAINE HCL (PF) 1 % IJ SOLN
INTRAMUSCULAR | Status: DC | PRN
  Administered 2019-10-26: 2 mL

## 2019-10-26 MED ORDER — VERAPAMIL HCL 2.5 MG/ML IV SOLN
INTRAVENOUS | Status: AC
  Filled 2019-10-26: qty 2

## 2019-10-26 MED ORDER — PANTOPRAZOLE SODIUM 40 MG PO TBEC
40.0000 mg | DELAYED_RELEASE_TABLET | Freq: Every day | ORAL | Status: DC
  Administered 2019-10-26 – 2019-10-27 (×2): 40 mg via ORAL
  Filled 2019-10-26 (×2): qty 1

## 2019-10-26 MED ORDER — ASPIRIN 81 MG PO CHEW: 81.0000 mg | CHEWABLE_TABLET | ORAL | Status: DC

## 2019-10-26 MED ORDER — ONDANSETRON HCL 4 MG/2ML IJ SOLN: 4.0000 mg | Freq: Four times a day (QID) | INTRAMUSCULAR | Status: DC | PRN

## 2019-10-26 MED ORDER — ATORVASTATIN CALCIUM 10 MG PO TABS
20.0000 mg | ORAL_TABLET | Freq: Every day | ORAL | Status: DC
  Administered 2019-10-26: 20 mg via ORAL
  Filled 2019-10-26: qty 2

## 2019-10-26 MED ORDER — SERTRALINE HCL 100 MG PO TABS
100.0000 mg | ORAL_TABLET | Freq: Every day | ORAL | Status: DC
  Administered 2019-10-26 – 2019-10-27 (×2): 100 mg via ORAL
  Filled 2019-10-26 (×2): qty 1

## 2019-10-26 MED ORDER — GABAPENTIN 100 MG PO CAPS
100.0000 mg | ORAL_CAPSULE | Freq: Every day | ORAL | Status: DC
  Administered 2019-10-26: 100 mg via ORAL
  Filled 2019-10-26: qty 1

## 2019-10-26 MED ORDER — LABETALOL HCL 5 MG/ML IV SOLN: 10.0000 mg | INTRAVENOUS | Status: AC | PRN

## 2019-10-26 MED ORDER — POTASSIUM CHLORIDE CRYS ER 20 MEQ PO TBCR
EXTENDED_RELEASE_TABLET | ORAL | Status: AC
  Filled 2019-10-26: qty 2

## 2019-10-26 MED ORDER — HYDRALAZINE HCL 20 MG/ML IJ SOLN: 10.0000 mg | INTRAMUSCULAR | Status: AC | PRN

## 2019-10-26 MED ORDER — ACETAMINOPHEN 325 MG PO TABS: 650.0000 mg | ORAL_TABLET | Freq: Four times a day (QID) | ORAL | Status: DC | PRN

## 2019-10-26 MED ORDER — POTASSIUM CHLORIDE CRYS ER 20 MEQ PO TBCR
40.0000 meq | EXTENDED_RELEASE_TABLET | Freq: Once | ORAL | Status: AC
  Administered 2019-10-26: 40 meq via ORAL

## 2019-10-26 MED ORDER — HEPARIN (PORCINE) IN NACL 1000-0.9 UT/500ML-% IV SOLN
INTRAVENOUS | Status: DC | PRN
  Administered 2019-10-26 (×2): 500 mL

## 2019-10-26 MED ORDER — LISINOPRIL 40 MG PO TABS
40.0000 mg | ORAL_TABLET | Freq: Every day | ORAL | Status: DC
  Administered 2019-10-26 – 2019-10-27 (×2): 40 mg via ORAL
  Filled 2019-10-26 (×2): qty 1

## 2019-10-26 MED ORDER — MELOXICAM 7.5 MG PO TABS
15.0000 mg | ORAL_TABLET | Freq: Every day | ORAL | Status: DC
  Administered 2019-10-26 – 2019-10-27 (×2): 15 mg via ORAL
  Filled 2019-10-26 (×2): qty 2

## 2019-10-26 MED ORDER — ENOXAPARIN SODIUM 40 MG/0.4ML ~~LOC~~ SOLN: 40.0000 mg | SUBCUTANEOUS | Status: DC

## 2019-10-26 MED ORDER — SODIUM CHLORIDE 0.9 % WEIGHT BASED INFUSION
1.0000 mL/kg/h | INTRAVENOUS | Status: AC
  Administered 2019-10-26: 1 mL/kg/h via INTRAVENOUS

## 2019-10-26 MED ORDER — SODIUM CHLORIDE 0.9% FLUSH: 3.0000 mL | Freq: Two times a day (BID) | INTRAVENOUS | Status: DC

## 2019-10-26 MED ORDER — SODIUM CHLORIDE 0.9 % WEIGHT BASED INFUSION: 3.0000 mL/kg/h | INTRAVENOUS | Status: DC

## 2019-10-26 MED ORDER — SODIUM CHLORIDE 0.9 % IV SOLN: 250.0000 mL | INTRAVENOUS | Status: DC | PRN

## 2019-10-26 MED ORDER — HEPARIN (PORCINE) IN NACL 1000-0.9 UT/500ML-% IV SOLN
INTRAVENOUS | Status: AC
  Filled 2019-10-26: qty 500

## 2019-10-26 MED ORDER — LIDOCAINE HCL (PF) 1 % IJ SOLN
INTRAMUSCULAR | Status: AC
  Filled 2019-10-26: qty 30

## 2019-10-26 MED ORDER — HEPARIN SODIUM (PORCINE) 1000 UNIT/ML IJ SOLN
INTRAMUSCULAR | Status: AC
  Filled 2019-10-26: qty 1

## 2019-10-26 MED ORDER — ACETAMINOPHEN 325 MG PO TABS: 650.0000 mg | ORAL_TABLET | ORAL | Status: DC | PRN

## 2019-10-26 MED ORDER — LORATADINE 10 MG PO TABS
10.0000 mg | ORAL_TABLET | Freq: Every day | ORAL | Status: DC
  Administered 2019-10-26 – 2019-10-27 (×2): 10 mg via ORAL
  Filled 2019-10-26 (×2): qty 1

## 2019-10-26 MED ORDER — SODIUM CHLORIDE 0.9 % WEIGHT BASED INFUSION: 1.0000 mL/kg/h | INTRAVENOUS | Status: DC

## 2019-10-26 MED ORDER — TERAZOSIN HCL 1 MG PO CAPS
1.0000 mg | ORAL_CAPSULE | Freq: Every day | ORAL | Status: DC
  Administered 2019-10-26: 1 mg via ORAL
  Filled 2019-10-26 (×2): qty 1

## 2019-10-26 MED ORDER — NITROGLYCERIN 0.4 MG SL SUBL
0.4000 mg | SUBLINGUAL_TABLET | SUBLINGUAL | Status: DC | PRN
  Administered 2019-10-26: 0.4 mg via SUBLINGUAL
  Filled 2019-10-26: qty 1

## 2019-10-26 MED ORDER — NITROGLYCERIN 0.4 MG SL SUBL: 0.4000 mg | SUBLINGUAL_TABLET | SUBLINGUAL | Status: DC | PRN

## 2019-10-26 MED ORDER — HEPARIN SODIUM (PORCINE) 1000 UNIT/ML IJ SOLN
INTRAMUSCULAR | Status: DC | PRN
  Administered 2019-10-26: 4000 [IU] via INTRAVENOUS

## 2019-10-26 MED ORDER — HYDROXYZINE HCL 25 MG PO TABS
25.0000 mg | ORAL_TABLET | Freq: Two times a day (BID) | ORAL | Status: DC
  Administered 2019-10-26 – 2019-10-27 (×2): 25 mg via ORAL
  Filled 2019-10-26 (×2): qty 1

## 2019-10-26 MED ORDER — ALBUTEROL SULFATE (2.5 MG/3ML) 0.083% IN NEBU: 3.0000 mL | INHALATION_SOLUTION | Freq: Four times a day (QID) | RESPIRATORY_TRACT | Status: DC | PRN

## 2019-10-26 MED ORDER — VERAPAMIL HCL 2.5 MG/ML IV SOLN
INTRAVENOUS | Status: DC | PRN
  Administered 2019-10-26: 10 mL via INTRA_ARTERIAL

## 2019-10-26 MED ORDER — AMLODIPINE BESYLATE 5 MG PO TABS
5.0000 mg | ORAL_TABLET | Freq: Every day | ORAL | Status: DC
  Administered 2019-10-26 – 2019-10-27 (×2): 5 mg via ORAL
  Filled 2019-10-26 (×2): qty 1

## 2019-10-26 MED ORDER — IOHEXOL 350 MG/ML SOLN
INTRAVENOUS | Status: DC | PRN
  Administered 2019-10-26: 45 mL

## 2019-10-26 SURGICAL SUPPLY — 9 items
CATH 5FR JL3.5 JR4 ANG PIG MP (CATHETERS) ×2 IMPLANT
DEVICE RAD COMP TR BAND LRG (VASCULAR PRODUCTS) ×2 IMPLANT
GLIDESHEATH SLEND SS 6F .021 (SHEATH) ×2 IMPLANT
GUIDEWIRE INQWIRE 1.5J.035X260 (WIRE) ×1 IMPLANT
INQWIRE 1.5J .035X260CM (WIRE) ×2
KIT HEART LEFT (KITS) ×2 IMPLANT
PACK CARDIAC CATHETERIZATION (CUSTOM PROCEDURE TRAY) ×2 IMPLANT
TRANSDUCER W/STOPCOCK (MISCELLANEOUS) ×2 IMPLANT
TUBING CIL FLEX 10 FLL-RA (TUBING) ×2 IMPLANT
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
## 2019-10-26 NOTE — Interval H&P Note (Signed)
History and Physical Interval Note:  10/26/2019 1:45 PM  Mitchell Rush  has presented today for surgery, with the diagnosis of unstable angina.  The various methods of treatment have been discussed with the patient and family. After consideration of risks, benefits and other options for treatment, the patient has consented to  Procedure(s): LEFT HEART CATH AND CORONARY ANGIOGRAPHY (N/A) as a surgical intervention.  The patient's history has been reviewed, patient examined, no change in status, stable for surgery.  I have reviewed the patient's chart and labs.  Questions were answered to the patient's satisfaction.   Cath Lab Visit (complete for each Cath Lab visit)  Clinical Evaluation Leading to the Procedure:   ACS: Yes.    Non-ACS:    Anginal Classification: CCS III  Anti-ischemic medical therapy: Minimal Therapy (1 class of medications)  Non-Invasive Test Results: No non-invasive testing performed  Prior CABG: No previous CABG        Col[facility] 10/26/2019 1:46 PM'

## 2019-10-26 NOTE — ED Notes (Signed)
Pt transported to XRAY °

## 2019-10-26 NOTE — Progress Notes (Signed)
Followed up with pt's earlier c/o not feeling well. Pt is resting in bed and states "I feel fine". Will continue to monitor.

## 2019-10-26 NOTE — ED Triage Notes (Signed)
Pt arrived via EMS from home with chief complaint of chest pain and shortness of breath. Pt reported he was working on his car around 3 PM when he started to have intermittent chest pain. Pt stated that the pain changed and increased in intensity. Pt endorses SOB with his chest pain. EMS administered 324 mg of Asprin and pt stated his pain went from 5/10 to 3/10.
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
## 2019-10-26 NOTE — ED Provider Notes (Signed)
7:14 AM Care assumed from Dr. Christy Gentles.  At time of transfer of care, patient is awaiting results of delta troponin before admitting.  Patient returned to the emergency department after being here 2 days ago for recurrent chest pain.  Initial troponin has increased from few days ago.  He had a work-up including CT scans that did not show acute dissection or acute worsening of the ascending aneurysm.  He reports his pain today does not go to his back and he has good pulses according to the previous team.  Low suspicion for an aortic etiology at this time.  After troponin is back, will either call for admission for high risk chest pain to medicine service versus cardiology if the troponin is rising.  10:49 AM Second troponin is the same at 40.  It is more elevated than it was several days ago.  Patient still having a mild, 4/10 chest discomfort.  Given the continued chest discomfort, will call for medicine admission for further work-up and management.  11:12 AM Spoke with medicine teaching service who requested cardiology be consulted for recommendations.  Cardiology called.  Cardiology was called who will come see the patient.  After their evaluation, they want to take him to the Cath Lab.      [name], [name], MD 10/26/19 [phone]
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
## 2019-10-26 NOTE — Progress Notes (Signed)
Pt with c/o "just don't feel good". Denied pain or chest discomfort. Unable to voice any other descriptors. Had been sleeping previously and was awakened from phone call. O2 sat 92% RA. O2 @2L  applied at this time. Bed alarm on. Will continue to monitor.

## 2019-10-26 NOTE — H&P (Addendum)
Date: 10/26/2019               Patient Name:  Mitchell Rush MRN: [id number]  DOB: Dec 16, 1934 Age / Sex: 85 y.o., male   PCP: Windell Hummingbird, PA-C         Medical Service: Internal Medicine Teaching Service         Attending Physician: Dr. Evette Doffing, Mallie Mussel, *    First Contact: Darrick Meigs, MD, Rylee Pager: [place] [phone])  Second Contact: Sharon Seller DO, Jaimie PagerJari Pigg [phone])       After Hours (After 5p/  First Contact Pager: [phone]  weekends / holidays): Second Contact Pager: [phone]   Chief Complaint: chest pain  History of Present Illness: 85 y.o. yo male w/ PMH significant for AAA,TAA, CAD, COPD, HTN, Prostate cancer, GERD.  Presents with recurrent chest pain.  He reports chest pain for the last 3-4 days for about 3-4 times per day.  He describes it as a pressure that lasts for hours at a time.  It is non radiating, not pleuritic.  It is associated with shortness of breath sometimes it improves with rest.  He doesn't feel like it's necessarily associated with exertion.  He did have to stop working on his car yesterday due to dyspnea.  He has had a nuclear stress test in 2016 without reversible ischemia or infarction, cardiac cath in 2017 showing nonobstructive disease.  He is not on asa at home due to NSAID therapy for arthritis pain per his daughter.  He gets most of his [facility] at [facility]. He is followed by vascular surgery for his aortic aneurysms.  His initial ED workup today showed HS troponin of 40 x2 and some t wave changes in lateral leads compared with two days ago.      Meds:  Current Meds  Medication Sig  . amLODipine (NORVASC) 5 MG tablet Take 5 mg by mouth daily.  Marland Kitchen atorvastatin (LIPITOR) 20 MG tablet Take 20 mg by mouth daily.  . feeding supplement, GLUCERNA SHAKE, (GLUCERNA SHAKE) LIQD Take 237 mLs by mouth 3 (three) times daily between meals.  . gabapentin (NEURONTIN) 100 MG capsule Take 100 mg by mouth at bedtime.  . hydrOXYzine (ATARAX/VISTARIL)  25 MG tablet Take 25 mg by mouth 2 (two) times daily.  Marland Kitchen lisinopril (PRINIVIL,ZESTRIL) 40 MG tablet Take 0.5 tablets (20 mg total) by mouth daily. (Patient taking differently: Take 40 mg by mouth daily. )  . loratadine (CLARITIN) 10 MG tablet Take 10 mg by mouth daily.  . meloxicam (MOBIC) 15 MG tablet Take 15 mg by mouth daily.  Marland Kitchen omeprazole (PRILOSEC) 40 MG capsule Take 40 mg by mouth 2 (two) times daily.  . sertraline (ZOLOFT) 100 MG tablet Take 100 mg by mouth daily.   Marland Kitchen terazosin (HYTRIN) 1 MG capsule Take 1 mg by mouth at bedtime.  . traZODone (DESYREL) 50 MG tablet Take 100 mg by mouth at bedtime.      Allergies: Allergies as of 10/26/2019  . (No Known Allergies)   Past Medical History:  Diagnosis Date  . AAA (abdominal aortic aneurysm) without rupture ([place])   . Abnormal echocardiogram   . Abnormal EKG   . Barrett's esophagus    hx of  . CAD (coronary artery disease)   . CKD (chronic kidney disease)    stage 2  . Colon polyp   . COPD (chronic obstructive pulmonary disease) ([place])   . Depression   . Diabetes ([place])   . Fatigue   .  Gastric ulcer    acute  . GERD (gastroesophageal reflux disease)   . H. pylori infection   . History of colon polyps   . Hypercholesteremia   . Hyperglycemia   . Hypertension   . Hypogonadism in male   . LVH (left ventricular hypertrophy) due to hypertensive disease   . Memory loss   . Mixed hyperlipidemia   . Osteopenia    osteoporosis  . Sleep apnea   . Thoracic aortic aneurysm (TAA) ([place])   . Vitamin D deficiency     Family History:  Family History  Problem Relation Age of Onset  . Heart disease Mother   . Hypertension Sister   . Diabetes Mellitus II Sister   . Diabetes Mellitus II Brother   . Cancer Brother      Social History:  Social History   Tobacco Use  . Smoking status: Former Smoker    Packs/day: 0.50    Types: Cigarettes  . Smokeless tobacco: Never Used  Substance Use Topics  . Alcohol use: Never  . Drug  use: Never    Quit smoking 2 years ago smoked since young No recreational drug use  Review of Systems: A complete ROS was negative except as per HPI.   Physical Exam: Blood pressure (!) 157/72, pulse 64, temperature (!) 97.5 F (36.4 C), temperature source Oral, resp. rate 19, height 5\' 8"  (1.727 m), weight 82.6 kg, SpO2 100 %. Physical Exam Constitutional:      Appearance: He is not diaphoretic.  HENT:     Head: Normocephalic and atraumatic.  Eyes:     General: No scleral icterus.       Right eye: No discharge.        Left eye: No discharge.  Cardiovascular:     Rate and Rhythm: Normal rate and regular rhythm.     Heart sounds: Normal heart sounds. No murmur. No friction rub. No gallop.   Pulmonary:     Effort: Pulmonary effort is normal. No respiratory distress.     Breath sounds: Normal breath sounds. No wheezing or rales.  Abdominal:     General: Bowel sounds are normal. There is no distension.     Palpations: Abdomen is soft. There is no mass.     Tenderness: There is no abdominal tenderness. There is no guarding.  Skin:    General: Skin is warm.  Neurological:     Mental Status: He is alert.  Psychiatric:        Mood and Affect: Mood normal.        Behavior: Behavior normal.     EKG: personally reviewed my interpretation is sinus rhythm, APC's, LVH, old anterior infarct, compared with ecg from 2 days prior it appears new t wave inversions are present in the lateral leads  CXR: personally reviewed my interpretation is no acute cardiopulmonary process, large lung volumes   Assessment & Plan by Problem: Active Problems:   Chest pain  Chest pain/CAD: chest pain with some typical features and associated dyspnea.  He has returned two days later with continued chest pain and with some new t wave inversions in the lateral leads of his ecg.  He received asa on the way to the ED this morning.  Nuclear stress test in 2016 without reversible ischemia or infarction, cardiac  cath in 2017 showing  nonobstructive disease  -consulted cardiology for further evaluation appreciate recommendations they are planning for cath this afternoon -SL nitro prn -cardiac telemetry -check lipid panel, hgb a1c -  medication reconciliation can continue lisinopril add beta blocker, continue atorvastatin, likely need to intensify statin  Ascending thoracic aortic aneurysm and AAA: stable in size, no signs of instability or dissection CTA 2 days prior  -follow up with vascular outpatient  COPD: occasional mild symptoms, not on daily inhaler at home  -monitor for now can add PRN albuterol for now  Prediabetes: noted 11 months ago   -monitor cbg and add insulin if needed -check hgb a1c  HTN: on lisinopril, amlodipine at home  -d/c amlodipine, continue lisinopril, may add beta blocker if bp/hr tolerates  GERD: w/ history of barrett's and gastric ulcer  -continue protonix 40 BID  Dispo: Admit patient to Observation with expected length of stay less than 2 midnights.  Signed: Katherine Roan, MD 10/26/2019, 1:59 PM

## 2019-10-26 NOTE — Plan of Care (Signed)
  Problem: Education: Goal: Knowledge of General Education information will improve Description: Including pain rating scale, medication(s)/side effects and non-pharmacologic comfort measures Outcome: Progressing   Problem: Education: Goal: Understanding of CV disease, CV risk reduction, and recovery process will improve Outcome: Progressing Goal: Individualized Educational Video(s) Outcome: Progressing   Problem: Activity: Goal: Ability to return to baseline activity level will improve Outcome: Progressing   Problem: Cardiovascular: Goal: Ability to achieve and maintain adequate cardiovascular perfusion will improve Outcome: Progressing Goal: Vascular access site(s) Level 0-1 will be maintained Outcome: Progressing   Problem: Health Behavior/Discharge Planning: Goal: Ability to safely manage health-related needs after discharge will improve Outcome: Progressing   Problem: Health Behavior/Discharge Planning: Goal: Ability to manage health-related needs will improve Outcome: Progressing   Problem: Clinical Measurements: Goal: Ability to maintain clinical measurements within normal limits will improve Outcome: Progressing Goal: Will remain free from infection Outcome: Progressing Goal: Diagnostic test results will improve Outcome: Progressing Goal: Respiratory complications will improve Outcome: Progressing Goal: Cardiovascular complication will be avoided Outcome: Progressing   Problem: Activity: Goal: Risk for activity intolerance will decrease Outcome: Progressing   Problem: Nutrition: Goal: Adequate nutrition will be maintained Outcome: Progressing   Problem: Coping: Goal: Level of anxiety will decrease Outcome: Progressing   Problem: Elimination: Goal: Will not experience complications related to bowel motility Outcome: Progressing Goal: Will not experience complications related to urinary retention Outcome: Progressing   Problem: Pain Managment: Goal:  General experience of comfort will improve Outcome: Progressing   Problem: Safety: Goal: Ability to remain free from injury will improve Outcome: Progressing   Problem: Skin Integrity: Goal: Risk for impaired skin integrity will decrease Outcome: Progressing

## 2019-10-27 DIAGNOSIS — R0789 Other chest pain: Secondary | ICD-10-CM | POA: Diagnosis not present

## 2019-10-27 DIAGNOSIS — I714 Abdominal aortic aneurysm, without rupture: Secondary | ICD-10-CM

## 2019-10-27 DIAGNOSIS — I10 Essential (primary) hypertension: Secondary | ICD-10-CM | POA: Diagnosis not present

## 2019-10-27 DIAGNOSIS — R079 Chest pain, unspecified: Secondary | ICD-10-CM | POA: Diagnosis not present

## 2019-10-27 LAB — BASIC METABOLIC PANEL
Anion gap: 8 (ref 5–15)
BUN: 12 mg/dL (ref 8–23)
CO2: 26 mmol/L (ref 22–32)
Calcium: 8.3 mg/dL — ABNORMAL LOW (ref 8.9–10.3)
Chloride: 105 mmol/L (ref 98–111)
Creatinine, Ser: 1.14 mg/dL (ref 0.61–1.24)
GFR calc Af Amer: >60 mL/min (ref >60)
GFR calc non Af Amer: 60 mL/min — ABNORMAL LOW (ref >60)
Glucose, Bld: 112 mg/dL — ABNORMAL HIGH (ref 70–99)
Potassium: 3.5 mmol/L (ref 3.5–5.1)
Sodium: 139 mmol/L (ref 135–145)

## 2019-10-27 LAB — CBC
HCT: 41 % (ref 39.0–52.0)
Hemoglobin: 12.7 g/dL — ABNORMAL LOW (ref 13.0–17.0)
MCH: 22.7 pg — ABNORMAL LOW (ref 26.0–34.0)
MCHC: 31 g/dL (ref 30.0–36.0)
MCV: 73.3 fL — ABNORMAL LOW (ref 80.0–100.0)
Platelets: 231 10*3/uL (ref 150–400)
RBC: 5.59 MIL/uL (ref 4.22–5.81)
RDW: 15.6 % — ABNORMAL HIGH (ref 11.5–15.5)
WBC: 6 10*3/uL (ref 4.0–10.5)
nRBC: 0 % (ref 0.0–0.2)

## 2019-10-27 LAB — GLUCOSE, CAPILLARY: Glucose-Capillary: 113 mg/dL — ABNORMAL HIGH (ref 70–99)

## 2019-10-27 MED ORDER — ATORVASTATIN CALCIUM 40 MG PO TABS: 20.0000 mg | ORAL_TABLET | Freq: Every day | ORAL | 0 refills | Status: AC

## 2019-10-27 MED ORDER — DEXTROSE 50 % IV SOLN: 12.5000 g | INTRAVENOUS | Status: DC

## 2019-10-27 MED ORDER — METOPROLOL TARTRATE 12.5 MG HALF TABLET: 12.5000 mg | ORAL_TABLET | Freq: Two times a day (BID) | ORAL | Status: DC

## 2019-10-27 MED ORDER — IPRATROPIUM-ALBUTEROL 0.5-2.5 (3) MG/3ML IN SOLN
3.0000 mL | Freq: Once | RESPIRATORY_TRACT | Status: AC
  Administered 2019-10-27: 3 mL via RESPIRATORY_TRACT
  Filled 2019-10-27: qty 3

## 2019-10-27 NOTE — TOC Transition Note (Signed)
Transition of Care [facility]) - CM/SW Discharge Note   Patient Details  Name: Ezkiel Peddie MRN: [id number] Date of Birth: 11-16-1934  [facility] CM/SW Contact:  Bartholomew Crews, RN Phone Number: [phone] 10/27/2019, 3:07 PM   Clinical Narrative:    Spoke with patient and daughter at the bedside. PTA home with one of his daughters, and other daughter checks in regularly. Daughter asking about PCS services. Advised to follow up with PCP. Daughter to provide transportation home. No further TOC needs at this time.    Final next level of care: Home/Self Care Barriers to Discharge: No Barriers Identified   Patient Goals and CMS Choice Patient states their goals for this hospitalization and ongoing recovery are:: go home CMS Medicare.gov Compare Post Acute Care list provided to:: Patient Choice offered to / list presented to : NA  Discharge Placement                       Discharge Plan and Services In-house Referral: NA Discharge Planning Services: CM Consult Post Acute Care Choice: NA          DME Arranged: N/A DME Agency: NA       HH Arranged: NA HH Agency: NA        Social Determinants of Health (SDOH) Interventions     Readmission Risk Interventions No flowsheet data found.

## 2019-10-27 NOTE — Care Management CC44 (Signed)
Condition Code 44 Documentation Completed  Patient Details  Name: Naji Pelz MRN: [id number] Date of Birth: July 19, 1935   Condition Code 44 given:  Yes Patient signature on Condition Code 44 notice:  Yes Documentation of 2 MD's agreement:  Yes Code 44 added to claim:  Yes    Bartholomew Crews, RN 10/27/2019, 2:58 PM

## 2019-10-27 NOTE — Discharge Instructions (Signed)
The only medication change this hospitalization is an increase in your atorvastatin to 40mg . Please continue the rest of your medications that you have been taking.  I would also like you to schedule a hospital follow up appointment with your PCP and cardiologist at their next available opening.  Thank you for placing your trust in the Internal Medicine Team and we wish you the best!

## 2019-10-27 NOTE — Progress Notes (Addendum)
NAME:  Mitchell Rush, MRN:  [id number], DOB:  01-22-1935, LOS: 0 ADMISSION DATE:  10/26/2019   Subjective  Family at bedside this morning. Discussed heart cath and aortic aneurysm findings.  Pt notes improvement in chest pain from admission.   Objective   Blood pressure 135/64, pulse (!) 59, temperature 98.1 F (36.7 C), temperature source Oral, resp. rate 18, height 5\' 8"  (1.727 m), weight 82.6 kg, SpO2 98 %.     Intake/Output Summary (Last 24 hours) at 10/27/2019 0508 Last data filed at 10/27/2019 0400 Gross per 24 hour  Intake 1681.14 ml  Output --  Net 1681.14 ml   Filed Weights   10/26/19 0429  Weight: 82.6 kg    Examination: GENERAL: chronically ill appearing; in NAD CARDIAC: heart RRR.  PULMONARY: diminished lung sounds. No expiratory wheezes appreciated. No crackles. ABDOMEN: bs active  Significant Diagnostic Tests:   3/26 CTA chest/a/p>4.7x4.4 cm thoracic aortic aneurysm   3/26 LHC> mild non-obstructive CAD; normal LV function and LVEDP  Labs    CBC Latest Ref Rng & Units 10/26/2019 10/26/2019 10/24/2019  WBC 4.0 - 10.5 K/uL 5.4 5.4 -  Hemoglobin 13.0 - 17.0 g/dL 13.8 13.0 10.2(L)  Hematocrit 39.0 - 52.0 % 43.3 42.3 30.0(L)  Platelets 150 - 400 K/uL 248 237 -   BMP Latest Ref Rng & Units 10/26/2019 10/24/2019 10/24/2019  Glucose 70 - 99 mg/dL 215(H) 88 131(H)  BUN 8 - 23 mg/dL 10 7(L) 9  Creatinine 0.61 - 1.24 mg/dL 1.19 0.60(L) 1.18  Sodium 135 - 145 mmol/L 138 145 139  Potassium 3.5 - 5.1 mmol/L 3.1(L) 2.1(LL) 3.5  Chloride 98 - 111 mmol/L 100 110 100  CO2 22 - 32 mmol/L 26 - 28  Calcium 8.9 - 10.3 mg/dL 8.7(L) - 8.7(L)    Summary  Mitchell Rush is an 85 yo gentleman with PMH of CAD, ascending aortic aneurysm, COPD, hypertension who was admitted to IMTS for evaluation of chest pain and found to have non-obstructive CAD.  Assessment & Plan:  Active Problems:   Chest pain  Non-obstructive CAD. As seen on 3/26 LHC/angiogram. ASCVD  Hypertension. Blood  pressures good overnight.  Plan -Will continue with medical management for now with home doses of amlodipine 5mg  daily, lisinopril 40mg  daily. **Will escalate to high intensity statin with 40mg  lipitor. Will also start *beta blocker -No large vessel disease on angiogram.  Chest pain could be either noncardiac in etiology or due to small vessel ischemic heart disease. -Monitor another day in the hospital given ongoing small chest pain and initiation of beta-blocker.  Acute respiratory failure. Remains on 2L [place]. No baseline requirement of supplemental oxygen. O2 sats remain 98-99% so unlikely to need at discharge but will obtain an ambulatory O2 sat to make sure we don't need to send him home with supplemental oxygen. COPD. Low suspicion of exacerbation.  -Will try a one time duoneb since lung sounds are diminished.  -Family reports seeing a pulmonologist however unable to find a documented visit or prior PFTs in chart. Encouraged them to follow up with PCP regarding this issue.  Type 2 DM. A1C 6.6 on admission. Given his age, would consider holding off on pharmaceutical management at this time due to increased risk of hypoglycemia.  Microcytosis. hgb wnl. Iron panel not indicitive of iron deficiency. Likely related to ACD.  Best practice:  CODE STATUS: Full Diet: cardiac DVT for prophylaxis: lovenox Dispo: likely stable for discharge later today   Mitzi Hansen, MD [place] PGY-1  PAGER #: [phone] 10/27/19  5:08 AM

## 2019-10-27 NOTE — Progress Notes (Signed)
SATURATION QUALIFICATIONS: (This note is used to comply with regulatory documentation for home oxygen)  Patient Saturations on Room Air at Rest = 95%  Patient Saturations on Room Air while Ambulating = 95%  Patient Saturations on 0 Liters of oxygen while Ambulating = 98%  Please briefly explain why patient needs home oxygen:  Patient does not require oxygen with ambulation

## 2019-10-27 NOTE — Progress Notes (Addendum)
Progress Note  Patient Name: Mitchell Rush Date of Encounter: 10/27/2019  Primary Cardiologist: No primary care provider on file.   Subjective   Reports he continues to have intermittent chest pain.  Underwent cath yesterday, showed mild nonobstructive CAD with normal LV systolic function and normal LVEDP.  Renal function stable at creatinine 1.1  Inpatient Medications    Scheduled Meds:  amLODipine  5 mg Oral Daily   atorvastatin  20 mg Oral q1800   dextrose  12.5 g Intravenous STAT   enoxaparin (LOVENOX) injection  40 mg Subcutaneous Q24H   gabapentin  100 mg Oral QHS   hydrOXYzine  25 mg Oral BID   lisinopril  40 mg Oral Daily   loratadine  10 mg Oral Daily   meloxicam  15 mg Oral Daily   pantoprazole  40 mg Oral Daily   sertraline  100 mg Oral Daily   sodium chloride flush  3 mL Intravenous Q12H   terazosin  1 mg Oral QHS   traZODone  100 mg Oral QHS   Continuous Infusions:  sodium chloride     PRN Meds: sodium chloride, acetaminophen, nitroGLYCERIN, ondansetron (ZOFRAN) IV, sodium chloride flush   Vital Signs    Vitals:   10/27/19 0523 10/27/19 0528 10/27/19 0731 10/27/19 0912  BP: (!) 127/52  (!) 161/73   Pulse: 61  61   Resp: 17  19   Temp: 98.2 F (36.8 C)  97.8 F (36.6 C)   TempSrc: Oral  Oral   SpO2: 99%  99% 94%  Weight:  91.5 kg    Height:        Intake/Output Summary (Last 24 hours) at 10/27/2019 1129 Last data filed at 10/27/2019 0400 Gross per 24 hour  Intake 1681.14 ml  Output --  Net 1681.14 ml   Last 3 Weights 10/27/2019 10/26/2019 10/24/2019  Weight (lbs) 201 lb 11.2 oz 182 lb 192 lb  Weight (kg) 91.491 kg 82.555 kg 87.091 kg      Telemetry    NSR- Personally Reviewed  ECG    No new- Personally Reviewed  Physical Exam   GEN: No acute distress.   Neck: No JVD Cardiac: RRR, no murmurs, rubs, or gallops.  Respiratory: Clear to auscultation bilaterally. GI: Soft, nontender, non-distended  MS: No edema; No  deformity. R wrist soft, no hematoma Neuro:  Nonfocal  Psych: Normal affect   Labs    High Sensitivity Troponin:   Recent Labs  Lab 10/24/19 0919 10/24/19 1128 10/26/19 0516 10/26/19 0726  TROPONINIHS 30* 29* 40* 40*      Chemistry Recent Labs  Lab 10/24/19 0919 10/24/19 0919 10/24/19 0946 10/26/19 0516 10/27/19 0700  NA 139   < > 145 138 139  K 3.5   < > 2.1* 3.1* 3.5  CL 100   < > 110 100 105  CO2 28  --   --  26 26  GLUCOSE 131*   < > 88 215* 112*  BUN 9   < > 7* 10 12  CREATININE 1.18   < > 0.60* 1.19 1.14  CALCIUM 8.7*  --   --  8.7* 8.3*  PROT 6.6  --   --   --   --   ALBUMIN 3.6  --   --   --   --   AST 33  --   --   --   --   ALT 23  --   --   --   --  ALKPHOS 57  --   --   --   --   BILITOT 0.7  --   --   --   --   GFRNONAA 57*  --   --  57* 60*  GFRAA >60  --   --  >60 >60  ANIONGAP 11  --   --  12 8   < > = values in this interval not displayed.     Hematology Recent Labs  Lab 10/26/19 0516 10/26/19 1723 10/27/19 0629  WBC 5.4 5.4 6.0  RBC 5.70 5.93* 5.59  HGB 13.0 13.8 12.7*  HCT 42.3 43.3 41.0  MCV 74.2* 73.0* 73.3*  MCH 22.8* 23.3* 22.7*  MCHC 30.7 31.9 31.0  RDW 15.8* 15.7* 15.6*  PLT 237 248 231    BNP Recent Labs  Lab 10/26/19 1706  BNP 37.3     DDimer No results for input(s): DDIMER in the last 168 hours.   Radiology    DG Chest 2 View  Result Date: 10/26/2019 CLINICAL DATA:  Chest pain. EXAM: CHEST - 2 VIEW COMPARISON:  CT 10/24/2019.  Chest x-ray 10/24/2019. FINDINGS: Mediastinum hilar structures normal. Heart size stable. No focal infiltrate noted on today's exam. No pleural effusion or pneumothorax. Metallic fragments again noted over the chest. Degenerative changes thoracic spine. IMPRESSION: No acute cardiopulmonary disease. No evidence of infiltrate noted on today's exam. Electronically Signed   By: Marcello Moores  Register   On: 10/26/2019 06:06   CARDIAC CATHETERIZATION  Result Date: 10/26/2019  1st Mrg lesion is 20%  stenosed.  Prox RCA lesion is 30% stenosed.  The left ventricular systolic function is normal.  LV end diastolic pressure is normal.  The left ventricular ejection fraction is 55-65% by visual estimate.  1. Mild nonobstructive CAD 2. Normal LV function 3. Normal LVEDP Plan: medical management.    Cardiac Studies   Cath 09/28/19:  1st Mrg lesion is 20% stenosed.  Prox RCA lesion is 30% stenosed.  The left ventricular systolic function is normal.  LV end diastolic pressure is normal.  The left ventricular ejection fraction is 55-65% by visual estimate.   1. Mild nonobstructive CAD 2. Normal LV function 3. Normal LVEDP  Plan: medical management.   Patient Profile     85 y.o. male with a hx of hypertension, nonobstructive CAD, diabetes mellitus, hyperlipidemia, COPD, CKD stage III and  thoracic and abdominal aortic aneurysm who is being seen today for the evaluation of chest pain  Assessment & Plan    Shortness of breath/Chest pain: cath yesterday with mild nonobstructive disease and normal LVEDP and LV function.  No further cardiac work-up recommended.  Ascending thoracic aneurysm and abdominal aortic aneurysm: CTA 10/24/19 showed stable mild aneurysmal dilatation of the ascending thoracic aorta to 4.3cm. Abdominal aortic aneurysm displays minimal change in caliber ascompared to the prior study, approximately 4.7 x 4.4 cm as compared to 4.6 x 4.3 cm -Has follow-up with vascular as outpatient  Hypertension -Continue home regimen   [facility] will sign off.   Medication Recommendations:  Continue home meds, agree with increasing atorvastatin dose to 40 mg daily Other recommendations (labs, testing, etc):  None Follow up as an outpatient:  F/u with Dr Marijo File at [facility]  For questions or updates, please contact [place] Please consult [email or web address] for contact info under        Signed, Donato Heinz, MD  10/27/2019, 11:29 AM

## 2019-10-27 NOTE — Progress Notes (Signed)
SATURATION QUALIFICATIONS: (This note is used to comply with regulatory documentation for home oxygen)  Patient Saturations on Room Air at Rest = 98%  Patient Saturations on Room Air while Ambulating = min  93%  Patient Saturations on 0 Liters of oxygen while Ambulating = min 93%  Please briefly explain why patient needs home oxygen: Pt 02 sats range from 93-98% with ambulation, HR ranges from 75-90bpm with ambulation.   Horald Chestnut, PT

## 2019-10-27 NOTE — Progress Notes (Signed)
Update note:  After speaking with the attending earlier today, it was suggested that we keep him another night to monitor HR if we were to start beta blocker as suggested in 3/26 attending note.  2:03 PM Received page from bedside RN. Family wishing to discharge today as cardiology had told them that he was ok to discharge and did not mention a new medication. I discussed reasoning with them however I think it is reasonable to discharge today and have him follow up with cardiology and primary care regarding starting beta blocker.  Mitzi Hansen, MD  10/27/19 2:05 PM

## 2019-10-27 NOTE — Evaluation (Signed)
Occupational Therapy Evaluation Patient Details Name: Mitchell Rush MRN: [id number] DOB: 06-Apr-1935 Today's Date: 10/27/2019    History of Present Illness 85 y.o. yo male w/ PMH significant for AAA,TAA, CAD, COPD, HTN, Prostate cancer, GERD.  Presents with recurrent chest pain. after work up dx w/ unstable angina, s/p LEFT HEART CATH AND CORONARY ANGIOGRAPHY 3/26.   Clinical Impression   Pt admitted with see above. Pt currently with functional limitations due to the deficits listed below (see OT Problem List). Pt lives alone but daughter coming in/out of home to assist. PLOF they were walking about 1 mile and would fish. Pt today on room air was at 95-97% o2 with transfers and mobility. Pt did not report any chest pain or SOB. Pt today required min assist with RLE dressing due to chronic back/RLE weakness. Pt was educated about long handle reacher and sock aide. Pt was educated about therapy services but they reported that everyone in the family has this.  Pt will benefit from skilled OT to increase their safety and independence with ADL and functional mobility for ADL to facilitate discharge to venue listed below.       Follow Up Recommendations  No OT follow up;Supervision - Intermittent    Equipment Recommendations  Other (comment)(sock aide and reacher)    Recommendations for Other Services       Precautions / Restrictions Precautions Precautions: Fall Precaution Comments: decrease knee stability but reports everyone in family has this Restrictions Weight Bearing Restrictions: No      Mobility Bed Mobility Overal bed mobility: Independent                Transfers Overall transfer level: Modified independent Equipment used: None             General transfer comment: able to get to/from bed sit/stand and also stand pivot with mod I    Balance Overall balance assessment: Modified Independent                                         ADL either  performed or assessed with clinical judgement   ADL Overall ADL's : Needs assistance/impaired Eating/Feeding: Independent   Grooming: Wash/dry hands;Wash/dry face;Independent   Upper Body Bathing: Independent   Lower Body Bathing: Supervison/ safety;Cueing for safety;Cueing for sequencing;Sit to/from stand   Upper Body Dressing : Independent   Lower Body Dressing: Minimal assistance;Sit to/from stand   Toilet Transfer: [place] and Hygiene: Modified independent;Sit to/from stand   Tub/ Shower Transfer: Tub transfer;Supervision/safety   Functional mobility during ADLs: Supervision/safety General ADL Comments: pt reported they like to sit in tub but advised not to complete      Vision Baseline Vision/History: Wears glasses Wears Glasses: At all times Patient Visual Report: No change from baseline Vision Assessment?: No apparent visual deficits     Perception Perception Perception Tested?: No   Praxis Praxis Praxis tested?: Within functional limits    Pertinent Vitals/Pain Pain Assessment: No/denies pain     Hand Dominance Right   Extremity/Trunk Assessment Upper Extremity Assessment Upper Extremity Assessment: Overall WFL for tasks assessed   Lower Extremity Assessment Lower Extremity Assessment: Defer to PT evaluation   Cervical / Trunk Assessment Cervical / Trunk Assessment: Normal   Communication Communication Communication: No difficulties   Cognition Arousal/Alertness: Awake/alert Behavior During Therapy: WFL for tasks assessed/performed Overall Cognitive Status: Within Functional  Limits for tasks assessed                                     General Comments  completed ambulation on room air with sats in 90s throughout max HR noted 90bmp    Exercises     Shoulder Instructions      Home Living Family/patient expects to be discharged to:: Private residence Living Arrangements:  Children;Alone Available Help at Discharge: Family Type of Home: House       Home Layout: Laundry or work area in basement     [employer] Shower/Tub: Teacher, early years/pre: Standard Bathroom Accessibility: Yes How Accessible: Accessible via walker Home Equipment: (has walking sticks)   Additional Comments: per pt they may use walking sticks      Prior Functioning/Environment Level of Independence: Independent with assistive device(s)        Comments: walking stick for balance        OT Problem List: Decreased strength;Decreased range of motion;Decreased activity tolerance;Impaired balance (sitting and/or standing);Decreased safety awareness;Decreased knowledge of use of DME or AE      OT Treatment/Interventions:      OT Goals(Current goals can be found in the care plan section) Acute Rehab OT Goals Patient Stated Goal: to go home today OT Goal Formulation: With patient Time For Goal Achievement: 11/10/19 Potential to Achieve Goals: Good  OT Frequency:     Barriers to D/C:            Co-evaluation              AM-PAC OT "6 Clicks" Daily Activity     Outcome Measure Help from another person eating meals?: None Help from another person taking care of personal grooming?: None Help from another person toileting, which includes using toliet, bedpan, or urinal?: None Help from another person bathing (including washing, rinsing, drying)?: A Little Help from another person to put on and taking off regular upper body clothing?: None Help from another person to put on and taking off regular lower body clothing?: A Little 6 Click Score: 22   End of Session Equipment Utilized During Treatment: Gait belt  Activity Tolerance: Patient tolerated treatment well Patient left: in bed;with call bell/phone within reach;with family/visitor present  OT Visit Diagnosis: Unsteadiness on feet (R26.81);Muscle weakness (generalized) (M62.81)                Time:  [id number] OT Time Calculation (min): 18 min Charges:  OT General Charges $OT Visit: 1 Visit  Joeseph Amor OTR/L  Acute Rehab Services  [phone] office number [phone] pager number   Joeseph Amor 10/27/2019, 12:42 PM

## 2019-10-27 NOTE — Evaluation (Signed)
Physical Therapy Evaluation Patient Details Name: Mitchell Rush MRN: [id number] DOB: 1935-07-19 Today's Date: 10/27/2019   History of Present Illness  85 y.o. yo male w/ PMH significant for AAA,TAA, CAD, COPD, HTN, Prostate cancer, GERD.  Presents with recurrent chest pain. after work up dx w/ unstable angina, s/p LEFT HEART CATH AND CORONARY ANGIOGRAPHY 3/26.  Clinical Impression   Pt admitted with above diagnosis. PTA was living home has daughter who stays with him and assists some with some ADLs and home care activities otherwise pt is independent with all ADLs, and IADLs, he states that he sometimes uses a walking stick and that he can tend to get shaky when ambulating, but denies any falls. Pt currently with functional limitations due to the deficits listed below (see PT Problem List). This am pt did great with mobility able to complete all with mod I- SBA. Ambulated approx 344ft with no AD and SBA for safety but no frank deficits noted with balance nor oxygenation. Pt ambulated on room air sats ranged between 93-98% and HR 75-90bpm. Pt will benefit from skilled PT to increase his overall independence and safety with mobility to allow discharge to the venue listed below.       Follow Up Recommendations No PT follow up    Equipment Recommendations  None recommended by PT    Recommendations for Other Services       Precautions / Restrictions Precautions Precautions: Fall Precaution Comments: states that gets "shaky" whne walking but denies hx of falling Restrictions Weight Bearing Restrictions: No      Mobility  Bed Mobility Overal bed mobility: Modified Independent                Transfers Overall transfer level: Modified independent Equipment used: None             General transfer comment: able to get to/from bed sit/stand and also stand pivot with mod I  Ambulation/Gait Ambulation/Gait assistance: Supervision Gait Distance (Feet): 300 Feet Assistive device:  None Gait Pattern/deviations: Step-through pattern;WFL(Within Functional Limits) Gait velocity: fair   General Gait Details: ambulated on room air and sats range from 93-98% no frank sx or distress noted with ambulation  Stairs            Wheelchair Mobility    Modified Rankin (Stroke Patients Only)       Balance Overall balance assessment: No apparent balance deficits (not formally assessed)                                           Pertinent Vitals/Pain Pain Assessment: No/denies pain    Home Living Family/patient expects to be discharged to:: Private residence Living Arrangements: Children;Alone Available Help at Discharge: Family Type of Home: House       Home Layout: Laundry or work area in basement(but only goes sporadically) Home Equipment: (has walking sticks)      Prior Function Level of Independence: Independent with assistive device(s)         Comments: walking stick for balance     Hand Dominance        Extremity/Trunk Assessment   Upper Extremity Assessment Upper Extremity Assessment: Overall WFL for tasks assessed    Lower Extremity Assessment Lower Extremity Assessment: Overall WFL for tasks assessed    Cervical / Trunk Assessment Cervical / Trunk Assessment: Normal  Communication   Communication: No difficulties  Cognition  Arousal/Alertness: Awake/alert Behavior During Therapy: WFL for tasks assessed/performed Overall Cognitive Status: Within Functional Limits for tasks assessed                                        General Comments General comments (skin integrity, edema, etc.): completed ambulation on room air with sats in 90s throughout max HR noted 90bmp    Exercises     Assessment/Plan    PT Assessment Patient needs continued PT services  PT Problem List Decreased activity tolerance       PT Treatment Interventions Gait training;Stair training;Functional mobility  training;Patient/family education    PT Goals (Current goals can be found in the Care Plan section)  Acute Rehab PT Goals Patient Stated Goal: feels better today and is waiting to go home PT Goal Formulation: With patient/family Time For Goal Achievement: 11/10/19 Potential to Achieve Goals: Good    Frequency Min 3X/week   Barriers to discharge        Co-evaluation               AM-PAC PT "6 Clicks" Mobility  Outcome Measure Help needed turning from your back to your side while in a flat bed without using bedrails?: None Help needed moving from lying on your back to sitting on the side of a flat bed without using bedrails?: None Help needed moving to and from a bed to a chair (including a wheelchair)?: None Help needed standing up from a chair using your arms (e.g., wheelchair or bedside chair)?: None Help needed to walk in hospital room?: A Little Help needed climbing 3-5 steps with a railing? : A Little 6 Click Score: 22    End of Session   Activity Tolerance: Patient tolerated treatment well Patient left: in bed;with call bell/phone within reach;with family/visitor present;with nursing/sitter in room Nurse Communication: Mobility status PT Visit Diagnosis: Other abnormalities of gait and mobility (R26.89)    Time: [id number] PT Time Calculation (min) (ACUTE ONLY): 19 min   Charges:   PT Evaluation $PT Eval Low Complexity: 1 Low          Horald Chestnut, PT   Delford Field 10/27/2019, 9:49 AM

## 2019-10-27 NOTE — Care Management Obs Status (Signed)
[facility] NOTIFICATION   Patient Details  Name: Mitchell Rush MRN: [id number] Date of Birth: 06-07-1935   Medicare Observation Status Notification Given:  Yes    Bartholomew Crews, RN 10/27/2019, 2:58 PM

## 2019-10-28 NOTE — Discharge Summary (Signed)
Name: Mitchell Rush MRN: [id number] DOB: 1935-06-22 85 y.o. PCP: Windell Hummingbird, PA-C  Date of Admission: 10/26/2019  4:17 AM Date of Discharge: 10/27/2019 Attending Physician: Dr. Evette Doffing  Discharge Diagnosis: 1. Non-cardiac chest pain 2. Non-obstructive CAD 3. COPD 4. Newly diagnosed T2DM  Discharge Medications: Allergies as of 10/27/2019   No Known Allergies     Medication List    STOP taking these medications   celecoxib 100 MG capsule Commonly known as: CeleBREX     TAKE these medications   amLODipine 5 MG tablet Commonly known as: NORVASC Take 5 mg by mouth daily.   atorvastatin 40 MG tablet Commonly known as: LIPITOR Take 0.5 tablets (20 mg total) by mouth daily. What changed: medication strength   feeding supplement (GLUCERNA SHAKE) Liqd Take 237 mLs by mouth 3 (three) times daily between meals.   gabapentin 100 MG capsule Commonly known as: NEURONTIN Take 100 mg by mouth at bedtime.   hydrOXYzine 25 MG tablet Commonly known as: ATARAX/VISTARIL Take 25 mg by mouth 2 (two) times daily.   lisinopril 40 MG tablet Commonly known as: ZESTRIL Take 0.5 tablets (20 mg total) by mouth daily. What changed: how much to take   loratadine 10 MG tablet Commonly known as: CLARITIN Take 10 mg by mouth daily.   meloxicam 15 MG tablet Commonly known as: MOBIC Take 15 mg by mouth daily.   nitroGLYCERIN 0.4 MG SL tablet Commonly known as: NITROSTAT Place 1 tablet (0.4 mg total) under the tongue every 5 (five) minutes as needed for chest pain.   omeprazole 40 MG capsule Commonly known as: PRILOSEC Take 40 mg by mouth 2 (two) times daily.   sertraline 100 MG tablet Commonly known as: ZOLOFT Take 100 mg by mouth daily.   terazosin 1 MG capsule Commonly known as: HYTRIN Take 1 mg by mouth at bedtime.   traZODone 50 MG tablet Commonly known as: DESYREL Take 100 mg by mouth at bedtime.       Disposition and follow-up:   Mitchell Rush was discharged  from [facility] in Stable condition.  At the hospital follow up visit please address:  1.  Non-obstructive CAD. LHC neg for acute blockage. Lipitor increase to 40mg  for high intensity. He may also benefit from a beta blocker. Please re-evaluate blood pressures at time of follow up.  2. COPD. He would likely benefit from PFTs and a medication regimen.   3. Newly diagnosed T2DM. A1C on admission 6.6. Would not treat pharmaceutically given risk for hypoglycemia. Repeat A1C in 3 mo.   Follow-up Appointments: Follow-up Information    Windell Hummingbird, PA-C Follow up.   Specialty: Physician Assistant Contact information: [place] [address] [phone]        Denton Brick., MD Follow up.   Specialty: Cardiology Contact information: [address] [place] [address] [phone]           Hospital Course: Mitchell Rush is an 85 year old male with a past medical history of coronary artery disease, COPD, hypertension, prostate cancer, and thoracic and abdominal aortic aneurysms who presented on October 26, 2019 for chest pain and shortness of breath. In the ED, he had mildly elevated troponins and some lateral T wave changes and was subsequently taken for a left heart cath which revealed mild nonobstructive coronary artery disease with a normal LVEDP.  Over the course of the day, the chest pain and shortness of breath resolved.  Also of  note, A1c on admission was 6.6.  No prior history of diabetes.  No pharmaceutical management started during admission and he was recommended to follow-up with a repeat A1c in 3 months.  Discharge Vitals:   BP (!) 161/73 (BP Location: Right Arm)   Pulse 61   Temp 97.8 F (36.6 C) (Oral)   Resp 19   Ht 5\' 8"  (1.727 m)   Wt 91.5 kg   SpO2 94%   BMI 30.67 kg/m   Pertinent Labs, Studies, and Procedures:  3/26 LHC  1st Mrg lesion is 20% stenosed.  Prox RCA lesion is 30%  stenosed.  The left ventricular systolic function is normal.  LV end diastolic pressure is normal.  The left ventricular ejection fraction is 55-65% by visual estimate.   1. Mild nonobstructive CAD 2. Normal LV function 3. Normal LVEDP  Discharge Instructions: Discharge Instructions    Diet - low sodium heart healthy   Complete by: As directed       Signed: Mitzi Hansen, MD 10/28/2019, 1:43 PM   Pager: [phone]

## 2019-10-29 ENCOUNTER — Encounter: Payer: Self-pay | Admitting: Cardiology

## 2019-11-14 ENCOUNTER — Ambulatory Visit: Payer: Medicare HMO | Admitting: Vascular Surgery

## 2019-11-22 ENCOUNTER — Ambulatory Visit (INDEPENDENT_AMBULATORY_CARE_PROVIDER_SITE_OTHER): Payer: Medicare Other | Admitting: Vascular Surgery

## 2019-11-22 ENCOUNTER — Encounter: Payer: Self-pay | Admitting: Vascular Surgery

## 2019-11-22 ENCOUNTER — Other Ambulatory Visit: Payer: Self-pay

## 2019-11-22 VITALS — BP 188/86 | HR 79 | Temp 97.5°F | Resp 18 | Ht 68.0 in | Wt 207.0 lb

## 2019-11-22 DIAGNOSIS — I723 Aneurysm of iliac artery: Secondary | ICD-10-CM

## 2019-11-22 DIAGNOSIS — I712 Thoracic aortic aneurysm, without rupture: Secondary | ICD-10-CM | POA: Diagnosis not present

## 2019-11-22 DIAGNOSIS — I714 Abdominal aortic aneurysm, without rupture, unspecified: Secondary | ICD-10-CM

## 2019-11-22 DIAGNOSIS — I7121 Aneurysm of the ascending aorta, without rupture: Secondary | ICD-10-CM
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
## 2019-11-27 ENCOUNTER — Other Ambulatory Visit: Payer: Self-pay | Admitting: *Deleted

## 2019-11-27 DIAGNOSIS — I714 Abdominal aortic aneurysm, without rupture, unspecified: Secondary | ICD-10-CM
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
